# Patient Record
Sex: Female | Born: 1955
Health system: Southern US, Community
[De-identification: ages and names within clinical notes are randomized; demographics above are authoritative.]

## PROBLEM LIST (undated history)

## (undated) DIAGNOSIS — L409 Psoriasis, unspecified: Secondary | ICD-10-CM

## (undated) DIAGNOSIS — Z1501 Genetic susceptibility to malignant neoplasm of breast: Secondary | ICD-10-CM

## (undated) DIAGNOSIS — D219 Benign neoplasm of connective and other soft tissue, unspecified: Secondary | ICD-10-CM

## (undated) DIAGNOSIS — E7800 Pure hypercholesterolemia, unspecified: Secondary | ICD-10-CM

## (undated) DIAGNOSIS — J45909 Unspecified asthma, uncomplicated: Secondary | ICD-10-CM

## (undated) DIAGNOSIS — F419 Anxiety disorder, unspecified: Secondary | ICD-10-CM

## (undated) HISTORY — PX: DILATION AND CURETTAGE OF UTERUS: SHX78

## (undated) HISTORY — PX: BARTHOLIN GLAND CYST EXCISION: SHX565

## (undated) HISTORY — DX: Genetic susceptibility to malignant neoplasm of breast: Z15.01

## (undated) HISTORY — DX: Psoriasis, unspecified: L40.9

## (undated) HISTORY — DX: Anxiety disorder, unspecified: F41.9

## (undated) HISTORY — DX: Benign neoplasm of connective and other soft tissue, unspecified: D21.9

## (undated) HISTORY — DX: Unspecified asthma, uncomplicated: J45.909

## (undated) HISTORY — DX: Pure hypercholesterolemia, unspecified: E78.00

---

## 1986-09-11 HISTORY — PX: AUGMENTATION MAMMAPLASTY: SUR837

## 1998-08-20 ENCOUNTER — Other Ambulatory Visit: Admission: RE | Admit: 1998-08-20 | Discharge: 1998-08-20 | Payer: Self-pay | Admitting: Gynecology

## 1998-09-11 ENCOUNTER — Ambulatory Visit (HOSPITAL_COMMUNITY): Admission: RE | Admit: 1998-09-11 | Discharge: 1998-09-11 | Payer: Self-pay | Admitting: Family Medicine

## 1998-09-11 ENCOUNTER — Encounter: Payer: Self-pay | Admitting: Family Medicine

## 1999-07-23 ENCOUNTER — Ambulatory Visit (HOSPITAL_COMMUNITY): Admission: RE | Admit: 1999-07-23 | Discharge: 1999-07-23 | Payer: Self-pay | Admitting: Gynecology

## 1999-07-23 ENCOUNTER — Encounter (INDEPENDENT_AMBULATORY_CARE_PROVIDER_SITE_OTHER): Payer: Self-pay

## 1999-09-06 ENCOUNTER — Ambulatory Visit (HOSPITAL_COMMUNITY): Admission: RE | Admit: 1999-09-06 | Discharge: 1999-09-06 | Payer: Self-pay | Admitting: Family Medicine

## 1999-09-06 ENCOUNTER — Encounter: Payer: Self-pay | Admitting: Family Medicine

## 1999-12-23 ENCOUNTER — Encounter: Payer: Self-pay | Admitting: Family Medicine

## 1999-12-23 ENCOUNTER — Ambulatory Visit (HOSPITAL_COMMUNITY): Admission: RE | Admit: 1999-12-23 | Discharge: 1999-12-23 | Payer: Self-pay | Admitting: Family Medicine

## 2000-01-28 ENCOUNTER — Ambulatory Visit (HOSPITAL_COMMUNITY): Admission: RE | Admit: 2000-01-28 | Discharge: 2000-01-28 | Payer: Self-pay | Admitting: Family Medicine

## 2000-01-28 ENCOUNTER — Encounter: Payer: Self-pay | Admitting: Family Medicine

## 2000-06-05 ENCOUNTER — Encounter (INDEPENDENT_AMBULATORY_CARE_PROVIDER_SITE_OTHER): Payer: Self-pay

## 2000-06-05 ENCOUNTER — Ambulatory Visit (HOSPITAL_COMMUNITY): Admission: RE | Admit: 2000-06-05 | Discharge: 2000-06-05 | Payer: Self-pay | Admitting: Gynecology

## 2001-05-11 ENCOUNTER — Ambulatory Visit: Admission: RE | Admit: 2001-05-11 | Discharge: 2001-05-11 | Payer: Self-pay | Admitting: Gynecology

## 2001-05-25 ENCOUNTER — Ambulatory Visit (HOSPITAL_COMMUNITY): Admission: RE | Admit: 2001-05-25 | Discharge: 2001-05-25 | Payer: Self-pay | Admitting: Gynecology

## 2001-05-25 ENCOUNTER — Encounter (INDEPENDENT_AMBULATORY_CARE_PROVIDER_SITE_OTHER): Payer: Self-pay | Admitting: Specialist

## 2001-06-08 ENCOUNTER — Ambulatory Visit: Admission: RE | Admit: 2001-06-08 | Discharge: 2001-06-08 | Payer: Self-pay | Admitting: Gynecologic Oncology

## 2001-07-13 ENCOUNTER — Ambulatory Visit: Admission: RE | Admit: 2001-07-13 | Discharge: 2001-07-13 | Payer: Self-pay | Admitting: Gynecology

## 2001-08-06 ENCOUNTER — Other Ambulatory Visit: Admission: RE | Admit: 2001-08-06 | Discharge: 2001-08-06 | Payer: Self-pay | Admitting: Gynecology

## 2002-08-07 ENCOUNTER — Emergency Department (HOSPITAL_COMMUNITY): Admission: EM | Admit: 2002-08-07 | Discharge: 2002-08-07 | Payer: Self-pay

## 2002-09-05 ENCOUNTER — Other Ambulatory Visit: Admission: RE | Admit: 2002-09-05 | Discharge: 2002-09-05 | Payer: Self-pay | Admitting: Gynecology

## 2003-09-11 ENCOUNTER — Other Ambulatory Visit: Admission: RE | Admit: 2003-09-11 | Discharge: 2003-09-11 | Payer: Self-pay | Admitting: Gynecology

## 2003-12-15 ENCOUNTER — Ambulatory Visit (HOSPITAL_COMMUNITY): Admission: RE | Admit: 2003-12-15 | Discharge: 2003-12-15 | Payer: Self-pay | Admitting: Family Medicine

## 2004-09-12 ENCOUNTER — Other Ambulatory Visit: Admission: RE | Admit: 2004-09-12 | Discharge: 2004-09-12 | Payer: Self-pay | Admitting: Gynecology

## 2005-09-26 ENCOUNTER — Other Ambulatory Visit: Admission: RE | Admit: 2005-09-26 | Discharge: 2005-09-26 | Payer: Self-pay | Admitting: Gynecology

## 2006-10-15 ENCOUNTER — Other Ambulatory Visit: Admission: RE | Admit: 2006-10-15 | Discharge: 2006-10-15 | Payer: Self-pay | Admitting: Obstetrics and Gynecology

## 2008-01-17 ENCOUNTER — Encounter: Payer: Self-pay | Admitting: Obstetrics and Gynecology

## 2008-01-17 ENCOUNTER — Ambulatory Visit: Payer: Self-pay | Admitting: Obstetrics and Gynecology

## 2008-01-17 ENCOUNTER — Other Ambulatory Visit: Admission: RE | Admit: 2008-01-17 | Discharge: 2008-01-17 | Payer: Self-pay | Admitting: Obstetrics and Gynecology

## 2008-02-29 ENCOUNTER — Ambulatory Visit: Payer: Self-pay | Admitting: Obstetrics and Gynecology

## 2008-08-11 ENCOUNTER — Encounter: Admission: RE | Admit: 2008-08-11 | Discharge: 2008-08-11 | Payer: Self-pay | Admitting: Family Medicine

## 2009-01-22 ENCOUNTER — Ambulatory Visit: Payer: Self-pay | Admitting: Obstetrics and Gynecology

## 2009-01-22 ENCOUNTER — Other Ambulatory Visit: Admission: RE | Admit: 2009-01-22 | Discharge: 2009-01-22 | Payer: Self-pay | Admitting: Obstetrics and Gynecology

## 2010-02-10 HISTORY — PX: HYSTEROSCOPY: SHX211

## 2010-02-18 ENCOUNTER — Other Ambulatory Visit
Admission: RE | Admit: 2010-02-18 | Discharge: 2010-02-18 | Payer: Self-pay | Source: Home / Self Care | Admitting: Obstetrics and Gynecology

## 2010-02-18 ENCOUNTER — Ambulatory Visit
Admission: RE | Admit: 2010-02-18 | Discharge: 2010-02-18 | Payer: Self-pay | Source: Home / Self Care | Attending: Obstetrics and Gynecology | Admitting: Obstetrics and Gynecology

## 2010-03-05 ENCOUNTER — Ambulatory Visit
Admission: RE | Admit: 2010-03-05 | Discharge: 2010-03-05 | Payer: Self-pay | Source: Home / Self Care | Attending: Obstetrics and Gynecology | Admitting: Obstetrics and Gynecology

## 2010-03-07 ENCOUNTER — Ambulatory Visit
Admission: RE | Admit: 2010-03-07 | Discharge: 2010-03-07 | Payer: Self-pay | Source: Home / Self Care | Attending: Obstetrics and Gynecology | Admitting: Obstetrics and Gynecology

## 2010-03-25 ENCOUNTER — Other Ambulatory Visit (INDEPENDENT_AMBULATORY_CARE_PROVIDER_SITE_OTHER): Payer: BC Managed Care – PPO

## 2010-03-25 DIAGNOSIS — Z01818 Encounter for other preprocedural examination: Secondary | ICD-10-CM

## 2010-03-29 ENCOUNTER — Ambulatory Visit (HOSPITAL_BASED_OUTPATIENT_CLINIC_OR_DEPARTMENT_OTHER)
Admission: RE | Admit: 2010-03-29 | Discharge: 2010-03-29 | Disposition: A | Discharge status: Home / Self Care | Payer: BC Managed Care – PPO | Source: Ambulatory Visit | Attending: Obstetrics and Gynecology | Admitting: Obstetrics and Gynecology

## 2010-03-29 ENCOUNTER — Other Ambulatory Visit: Payer: Self-pay | Admitting: Obstetrics and Gynecology

## 2010-03-29 DIAGNOSIS — N840 Polyp of corpus uteri: Secondary | ICD-10-CM | POA: Insufficient documentation

## 2010-03-29 DIAGNOSIS — N950 Postmenopausal bleeding: Secondary | ICD-10-CM | POA: Insufficient documentation

## 2010-03-29 DIAGNOSIS — Z01810 Encounter for preprocedural cardiovascular examination: Secondary | ICD-10-CM | POA: Insufficient documentation

## 2010-03-29 DIAGNOSIS — Z01812 Encounter for preprocedural laboratory examination: Secondary | ICD-10-CM | POA: Insufficient documentation

## 2010-03-29 DIAGNOSIS — Z8041 Family history of malignant neoplasm of ovary: Secondary | ICD-10-CM | POA: Insufficient documentation

## 2010-04-02 NOTE — Op Note (Addendum)
Chelsey Shepherd, MEN                ACCOUNT NO.:  [id number]  MEDICAL RECORD NO.:  [id number]          PATIENT TYPE:  LOCATION:                                 FACILITY:  PHYSICIAN:  Daniel L. Eda Paschal, M.D.   DATE OF BIRTH:  DATE OF PROCEDURE:  03/29/2010 DATE OF DISCHARGE:                              OPERATIVE REPORT   PREOPERATIVE DIAGNOSIS:  Postmenopausal bleeding, endometrial polyp.  POSTOPERATIVE DIAGNOSIS:  Postmenopausal bleeding, endometrial polyp.  OPERATION:  Hysteroscopy with excision of endometrial polyp and endometrial sampling.  SURGEON:  Daniel L. Eda Paschal, M.D.  ANESTHESIA:  General.  INDICATIONS:  The patient is a 55 year old gravida 2, para 0, AB 2 who had presented to the office for her yearly exam.  During the process of the yearly exam, we had made a decision to do an ultrasound because of her family history of ovarian cancer.  At the time of her ultrasound, her endometrial cavity was slightly enlarged and there was a well- defined endometrial polyp of approximately 1 cm present.  In questioning the patient more carefully at that point, it appeared that she had postmenopausal bleeding which initially she had denied.  She said she had spotted off and on for 2 months.  We were going to initiate hormone replacement therapy, but this bleeding had occurred prior to that.  As a result of the postmenopausal bleeding and the well-defined polyp, she now enters the operating room for hysteroscopy, D and C and excision of endometrial polyp.  FINDINGS:  External is normal.  BUS is normal.  Vagina is normal. Cervix is clean, but stenotic.  Uterus is normal size and shape without descensus.  Adnexa failed to reveal masses.  At the time of hysteroscopy, there was a well-defined endometrial polyp on the anterior fundal wall near the top of the fundus, tubal ostia, rest of the fundus, lower uterine segment and endocervical canal were free of disease.  PROCEDURE  IN DETAIL:  After adequate general anesthesia, the patient was placed in the dorsal lithotomy position and prepped and draped in the usual sterile manner.  A single-tooth tenaculum was placed in the anterior lip of the cervix.  Initially it was a little bit difficult to dilate the cervix because of the stenosis and her nulliparous state. However, we were successful and she was dilated to a #31 Pratt dilator. A hysteroscopic resectoscope was then introduced.  It was attached to a camera for magnification.  It was attached to a 90 degrees wire loop with appropriate Bovie settings, 1.5% glycine was used to expand the intrauterine cavity.  When the cavity was expanded, the endometrial polyp was identified.  It was excised with the 90 degrees wire loop and the base was cauterized.  It was sent to Pathology for tissue diagnosis. Endometrial curettings were also obtained, although there was really scant tissue, as she had a typical postmenopausal atrophic endometrium. At the termination of procedure, she was re-hysteroscoped.  There was absolutely no bleeding.  Fluid deficit was less than 100 cc.  Blood loss was minimal, so the procedure was terminated.  Pictures were taken for documentation.  The  patient left the operating room in satisfactory condition.     Daniel L. Eda Paschal, M.D.     Tonette Bihari  D:  03/29/2010  T:  03/29/2010  Job:  [id number]  Electronically Signed by Edyth Gunnels M.D. on 04/02/2010 09:19:20 AM

## 2010-04-15 ENCOUNTER — Ambulatory Visit (INDEPENDENT_AMBULATORY_CARE_PROVIDER_SITE_OTHER): Payer: BC Managed Care – PPO | Admitting: Obstetrics and Gynecology

## 2010-04-15 DIAGNOSIS — Z9889 Other specified postprocedural states: Secondary | ICD-10-CM

## 2010-06-28 NOTE — Op Note (Signed)
[facility]  Patient:    Chelsey Shepherd, Chelsey Shepherd                      MRN: [id number] Proc. Date: 07/23/99 Adm. Date:  [id number] Disc. Date: [id number] Attending:  Douglass Rivers                           Operative Report  PREOPERATIVE DIAGNOSIS:       Missed abortion, 9+ weeks.  POSTOPERATIVE DIAGNOSIS:      1. Missed abortion, 9+ weeks.                               2. Possible cervical pregnancy.  PROCEDURE:                    ______ dilation and evacuation.  SURGEON:                      Douglass Rivers, M.D.  ANESTHESIA:                   MAC and paracervical block.  ESTIMATED BLOOD LOSS:         100-200 cc.  FINDINGS:                     Normal cavity contour, brisk cervical bleeding.  COMPLICATIONS:                Cervical bleeding.  PATHOLOLGY:                   Uterine contents.  INDICATIONS:                  The patient is a 55 year old G2, P0 at 9 weeks confirmed missed AB by ultrasound.  The pregnancy was seen in the lower portion of the uterus without fetal heart tones.  DESCRIPTION OF PROCEDURE:     The patient was taken to the operating room and IV sedation was induced.  The patient was placed in the dorsal lithotomy position, prepped and draped in the usual sterile fashion.  A bivalve speculum was placed in the vagina.  The cervix was visualized and a paracervical block was placed.  The bivalve was removed and ______ confirmed.  A sterile weighted speculum was placed in the vagina.  However, the sterile weighted speculum was not long enough to visualized the cervix.  The bivalve was then replaced.  The cervix was stabilized with a single tooth tenaculum and gently dilated up to a #27 [place].  The #9 suction curet was advanced though the cervix.  Upon advancing through the cervix, brisk bleeding through the curet was noted.  Suction was obtained.  The uterus was cleared of its contents. The suction curet was removed and brisk bleeding  was noted pumping through the cervix.  The suction curet was then advanced back through the cervix.  The uterus was massaged.  The suction curet was removed.  Gentle curettage was performed, during which the vaginal bleeding worsened.  The suction curet was then advanced back through the cervix.  The patients uterus was massaged and the patient received Methergen.  After massage, the suction curet was removed. The uterus was continued to be massaged.  After about 12 minutes, a second Methergen dose was given for reassurance.  The instruments were removed then from  the vagina.  The patient appeared to be stable, at which point I left the OR.  Shortly thereafter, I was called back to the OR, as the patient began bleeding briskly again.  The bivalve speculum was placed in the vagina and there was a moderate amount of blood pouring through the cervix.  The single tooth tenaculum was replaced.  The uterus was massaged while and adequate sterile weighted speculum was obtained.  The cervix was then deviated left and right, and stay sutures were placed at 3 and 9 oclock with 2-0 Vicryl.  The uterus continued to be massaged and another dose of Methergen was given. While it slowed down, she did continue to bleed from her cervix.  The cervix was then injected with dilute Pitressin solution in four quadrants directed toward the internal os.  Approximately 10 cc of a solution of 20 units in 100 was placed in the cervix.  Again, uterine massage was performed.  A #16, 30 cc Foley was then advanced through the cervix, distended to 20 cc and placed on gentle traction.  Inspection of the cervix confirmed hemostasis.  There was a mild amount of blood coming through the Foley catheter, which would have been uterine in source.  We continued to observe the patient in the OR for about another 15-20 minutes.  She was given another dose of IM Methergen.  As she appeared stable, she was transferred to the PACU for  continued observation there.  If warranted, we will admit her for observation.  The uterine contents were sent for pathology. DD:  07/23/99 TD:  07/25/99 Job: [id number]

## 2010-06-28 NOTE — Op Note (Signed)
[facility]  Patient:    Chelsey Shepherd, Chelsey Shepherd Visit Number: [id number] MRN: [id number]          Service Type: DSU Location: DAY Attending Physician:  Jeannette Corpus Dictated by:   Rande Brunt. Clarke-Pearson, M.D. Proc. Date: 05/25/01 Admit Date:  05/25/2001   CC:         Douglass Rivers, M.D.  Telford Nab, R.N.   Operative Report  PREOPERATIVE DIAGNOSIS:  Chronic Bartholins abscess.  POSTOPERATIVE DIAGNOSIS:  Perineal-anal fistula.  PROCEDURE:  Excision of fistulous tract with perineoplasty.  SURGEON:  Daniel L. Clarke-Pearson, M.D.  ASSISTANT:  Telford Nab, R.N.  ANESTHESIA:  General with LMA.  ESTIMATED BLOOD LOSS:  30 cc.  SURGICAL FINDINGS:  The patient had a chronic draining sinus on the left side of the perineum.  When probing this with a wire probe at the time of surgery, it was noted that the sinus tract actually exited through the anus just above the anal sphincter.  DESCRIPTION OF PROCEDURE:  The patient was brought to the operating room and after satisfactory attainment of general anesthesia was placed in the lithotomy position in candy-cane stirrups.  The perineum and vagina were prepped with Betadine.  The patient was draped, and the bladder was drained with a straight catheter.  The sinus was probed with the wire probe, and it was noted that the probe exited a sinus tract exiting the anus.  With the probe in place, skin incision was then made overlying the probe, thus opening up the sinus tract.  In order to to do this, the anal sphincter was transected.  Using sharp dissection with Metzenbaum scissors and hemostasis being achieved with cautery, the sinus tract as well as the scar tissue in the perineum was excised completely back to healthy fat and normal rectal mucosa. Hemostasis was achieved with cautery and figure-of-eight 3-0 Vicryl sutures. The distal rectal mucosa was reapproximated with interrupted 3-0  Vicryl suture.  The anal sphincter was reapproximated with interrupted 2-0 Vicryl sutures in a circumferential fashion.  The deep perineal body was then reapproximated with interrupted sutures of 2-0 Vicryl.  The perineal/bulbar skin was then closed with a running subcuticular suture of 3-0 Vicryl.  The wound was injected with 0.25% Marcaine.  An ice pack was applied to the vulva and anus, and the patient was awakened from anesthesia and taken to the recovery room in satisfactory condition.  Sponge, needle, and instrument counts correct x 2. Dictated by:   Rande Brunt. Clarke-Pearson, M.D. Attending Physician:  Jeannette Corpus DD:  05/25/01 TD:  05/25/01 Job: [id number]

## 2010-06-28 NOTE — Consult Note (Signed)
[facility]  Patient:    Chelsey Shepherd, Chelsey Shepherd Visit Number: [id number] MRN: [id number]          Service Type: GON Location: GYN Attending Physician:  Jeannette Corpus Dictated by:   Rande Brunt. Clarke-Pearson, M.D. Proc. Date: 07/13/01 Admit Date:  07/13/2001 Discharge Date: 07/13/2001   CC:         Douglass Rivers, M.D.  Telford Nab, R.N.   Consultation Report  A 55 year old white female returns for postoperative follow-up having had a fistula in ano repaired on May 25, 2001.  She has had an uncomplicated postoperative course.  She reports today she is having normal bowel movements once a day.  She has no vaginal or perineal drainage, although she does occasionally have a slight amount of rectal bleeding.  Her stools are formed and sometimes constipated.  She did have hemorrhoids immediately following surgery which resolved spontaneously.  Currently, the patient is without pain and denies any pressure, swelling, or drainage.  PHYSICAL EXAMINATION  VITAL SIGNS:  Weight 125 pounds, blood pressure 100/60.  ABDOMEN:  Soft, nontender.  No mass, organomegaly, ascites, or hernias noted.  PELVIC:  EGBUS shows that the incision to the left of the midline and the perineum is well healed.  Palpation of this area reveals no nodularity, masses, fluctuance, or pain.  Inspection of the vagina reveals no abnormalities.  RECTAL:  Performed.  Again, there are small hemorrhoids, but no other abnormalities noted.  There is no blood on my rectal finger.  Palpation of the rectum reveals no evidence of communication to the vagina or perineum.  IMPRESSION:  Fistula in ano of questionable etiology.  Patient is given the okay to return to full levels of activity including resuming sexual intercourse.  Prior to the conclusion of our discussion the patient raised a question regarding family history of ovarian cancer.  She reports that her paternal grandmother  and paternal great grandmother both had ovarian cancer.  Given this history, I would suggest that she seek genetic counseling.  It would be reasonable in my mind to screen her with annual ultrasounds of the ovaries.  I do not believe that CA-125 would be worthwhile given the high false negative and false positive rates.  She will return to the care of Dr. Douglass Rivers. Dictated by:   Rande Brunt. Clarke-Pearson, M.D. Attending Physician:  Jeannette Corpus DD:  07/13/01 TD:  07/15/01 Job: [id number]

## 2010-06-28 NOTE — Consult Note (Signed)
[facility]  Patient:    Chelsey Shepherd, Chelsey Shepherd Visit Number: [id number] MRN: [id number]          Service Type: GON Location: GYN Attending Physician:  Sabino Donovan Dictated by:   Rande Brunt. Clarke-Pearson, M.D. Proc. Date: 06/15/01 Admit Date:  06/08/2001 Discharge Date: 06/08/2001   CC:         Douglass Rivers, M.D.  Telford Nab, R.N.   Consultation Report  A 55 year old patient returns for continued evaluation and postoperative recovery following repair of a fistula in ______.  Since her last visit she has gone back to work nearly full-time.  She reports she still has several bowel movements a day and a minimal amount of drainage from the anus.  She has minimal pain.  She is using sitz baths.  She denies any fever or chills or any loss of stool or flatus.  PHYSICAL EXAMINATION  VITAL SIGNS:  Weight 120 pounds, blood pressure 178/70.  PELVIC:  EGBUS is essentially normal.  The perineal incision is completely healed.  She does have some relatively prominent hemorrhoids at approximately 12 oclock.  Palpation of the vagina reveals no masses or significant tenderness.  Rectovaginal examination confirms.  There is no drainage of her sinus tracts or abscesses noted.  IMPRESSION:  Good postoperative recovery.  Patient is encouraged to use a donut when she is sitting at work to take pressure off her perineum and rectum.  She will continue to use sitz baths.  We will plan on seeing her back again in three to four weeks for follow-up. Dictated by:   Rande Brunt. Clarke-Pearson, M.D. Attending Physician:  Ronita Hipps T DD:  06/15/01 TD:  06/17/01 Job: [id number]

## 2010-06-28 NOTE — Consult Note (Signed)
[facility]  Patient:    Chelsey Shepherd, TIPPING Visit Number: [id number] MRN: [id number]          Service Type: Attending:  Rande Brunt. Clarke-Pearson, M.D. Dictated by:   Rande Brunt. Clarke-Pearson, M.D. Proc. Date: 05/11/01   CC:         Douglass Rivers, M.D.  Telford Nab, R.N.   Consultation Report  A 55 year old white married female referred by Dr. Douglass Rivers for management of a chronic Bartholins gland abscess.  The patient dates the initial onset of this abscess to approximately three years ago.  At that time she underwent incision and drainage.  However, this has recurred and subsequently was excised on June 05, 2000 showing benign inflamed squamous mucosa and submucosa consistent with an abscess.  No Bartholins gland was identified.  Subsequently, the patient has had continuing difficulties with pain, drainage, and bleeding from this area as well as a lump there.  She is having difficulty with sexual intercourse.  PAST MEDICAL HISTORY:  None.  PAST SURGICAL HISTORY:  D&C for incomplete abortion.  ALLERGIES:  None.  CURRENT MEDICATIONS:  None.  FAMILY HISTORY:  Negative for gynecologic, breast, or colon cancers.  REVIEW OF SYSTEMS:  Negative except as noted above.  SOCIAL HISTORY:  The patient is married.  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  PHYSICAL EXAMINATION  VITAL SIGNS:  Weight 123 pounds, height 5 feet 5 inches, blood pressure 120/80, pulse 68, respiratory rate 16.  GENERAL:  The patient is a healthy, slender white female in no acute distress.  HEENT:  Negative.  NECK:  Supple without thyromegaly.  LYMPH:  There is no supraclavicular or inguinal adenopathy.  ABDOMEN:  Soft, nontender.  No mass, organomegaly, ascites, or hernias are noted.  PELVIC:  EGBUS normal except for a nodule with draining sinus tract in the left posterior vulva consistent with a chronic Bartholins gland abscess  and associated fibrosis.  In aggregate this measures less than 1 cm in diameter. Palpation of the vagina reveals no extension.  The vagina is otherwise normal. Cervix is normal.  Uterus is anterior and normal shape, size, and consistency. There are no adnexal masses noted.  Rectovaginal examination confirms.  RECTAL:  Reveals no involvement, although this is sitting close to the rectal sphincter.  IMPRESSION:  Chronic Bartholins gland abscess now for about three years.  The patient strongly desires coming to resolution of this problem and I would advise wide local excision of this area with primary closure.  We will schedule this surgery to be done as an outpatient as soon as it is convenient. The risks of surgery including bleeding, infection, and wound breakdown have been discussed.  Patient accepts these risks and wishes to proceed. Dictated by:   Rande Brunt. Clarke-Pearson, M.D. Attending:  Rande Brunt. Clarke-Pearson, M.D. DD:  05/11/01 TD:  05/12/01 Job: [id number]

## 2010-06-28 NOTE — Consult Note (Signed)
[facility]  Patient:    CHASTIDY, RANKER Visit Number: [id number] MRN: [id number]          Service Type: GON Location: GYN Attending Physician:  Sabino Donovan Dictated by:   Jackquline Denmark. Kyla Balzarine, M.D. Admit Date:  06/08/2001   CC:         Telford Nab, R.N.  Douglass Rivers, M.D.   Consultation Report  HISTORY:  The patient is a drop in for evaluation of the perineum following resection of a perianal fistula tract, repaired with perineoplasty on May 25, 2001.  The patient has been concerned about scanty, malodorous discharge. The patient has had bowel function.  She does describe the sensation of vaginal flatus.  She has no problems with stool continence or drainage of stool or pus from the vagina.  PHYSICAL EXAMINATION:  VITAL SIGNS:  Stable and afebrile.  PELVIC:  Inspection of the perineum reveals an intact suture line with minimal edema/induration of the perineal body.  There is no sign of purulence.  There is no evidence of fistula on visual inspection just inside the introitus.  ASSESSMENT: 1. Fistula in ano postrepair. 2. Postoperative induration as expected.  PLAN:  The patient is reassured and may return to work.  We discussed the use of stool softeners and bulking agents.  She does note some pruritus, and can use 1% hydrocortisone ointment.  We discussed the use of Sitz baths, rinse/blot/blow dry regimen for vulvar hygiene.  She is scheduled to return to see Dr. Stanford Breed in one month. Dictated by:   Jackquline Denmark. Kyla Balzarine, M.D. Attending Physician:  Ronita Hipps T DD:  06/08/01 TD:  06/08/01 Job: [id number]

## 2010-06-28 NOTE — Op Note (Signed)
[facility]  Patient:    Chelsey Shepherd, Chelsey Shepherd                         MRN: [id number] Proc. Date: 06/05/00 Attending:  Douglass Rivers, M.D.                           Operative Report  PREOPERATIVE DIAGNOSES:       1. Left Bartholins fistula                               2. Dyspareunia.  POSTOPERATIVE DIAGNOSES:      1. Left Bartholins fistula                               2. Dyspareunia.  PROCEDURE:                    Excision of left Bartholins gland.  SURGEON:                      Douglass Rivers, M.D.  ANESTHESIA:                   General.  ESTIMATED BLOOD LOSS:         Minimal.  INDICATIONS:                  The patient is a 55 year old with a previous history of a Bartholins cyst, which underwent an I&D with packing, since which time she has had a draining fistula.  The patient reports increase in fistula drainage with mild blood tinging and dyspareunia, and presents for definitive surgery.  FINDINGS:                     There were hypertrophic changes to the fistula noted externally on the left labia majora.  There were indurated changes to the Bartholins gland itself.  COMPLICATIONS:                None.  PATHOLOGY:                    Bartholins gland.  DESCRIPTION OF PROCEDURE:     The patient was taken to the operating room, placed in the dorsal lithotomy position, prepped and draped in the usual sterile fashion after general anesthesia was induced.  The hypertrophic area of fistula was noted.  The fistula was probed and the orientation of the gland was verified.  The gland itself was not enlarged, but did feel slightly indurated.  The labia was stabilized with with a Kelly clamp.  The fistula was grasped and an elliptical incision was made around the fistula.  Careful attention to any underlying bleeding was treated with electrocautery.  The orientation of the gland again was redirected.  The area of concern and induration was stabilized  with an Allis clamp and was sharply dissected off. There was a small area of bleeding on the inferior aspect of the excision that was treated with a figure-of-eight, which also closed the dead space.  The skin was then closed with interrupted 2-0 Monocryl, which was used throughout. Examination of the area confirmed excision of the gland in its entirety.  Of note, we attempted to confirm this by placing a lacrimal duct dilator through the  anatomic Bartholins opening in the vagina.  However, because of scar tissue, we were unable to adequately ascertain its location.  The area was injected with 0.25% Marcaine solution, 6 cc of which was placed.  She tolerated the procedure well.  Sponge, lap, and needle counts were correct x 2.  She was extubated in the OR and transferred to the PACU in stable condition.  She was instructed to avoid anti-inflammatories.  Sitz baths to begin tomorrow.  She was given a prescription for Tylox, 1-2 q.4h. p.r.n. pain.  She will follow up in the office two weeks postoperatively. DD:  06/05/00 TD:  06/05/00 Job: [id number]

## 2011-02-22 ENCOUNTER — Other Ambulatory Visit: Payer: Self-pay | Admitting: Obstetrics and Gynecology

## 2011-03-05 ENCOUNTER — Other Ambulatory Visit: Payer: Self-pay | Admitting: Obstetrics and Gynecology

## 2011-03-07 DIAGNOSIS — E7800 Pure hypercholesterolemia, unspecified: Secondary | ICD-10-CM | POA: Insufficient documentation

## 2011-03-10 ENCOUNTER — Encounter: Payer: Self-pay | Admitting: Obstetrics and Gynecology

## 2011-03-12 ENCOUNTER — Other Ambulatory Visit: Payer: Self-pay | Admitting: Obstetrics and Gynecology

## 2011-03-31 ENCOUNTER — Other Ambulatory Visit: Payer: Self-pay | Admitting: Obstetrics and Gynecology

## 2011-03-31 ENCOUNTER — Other Ambulatory Visit (HOSPITAL_COMMUNITY)
Admission: RE | Admit: 2011-03-31 | Discharge: 2011-03-31 | Disposition: A | Discharge status: Home / Self Care | Payer: BC Managed Care – PPO | Source: Ambulatory Visit | Attending: Obstetrics and Gynecology | Admitting: Obstetrics and Gynecology

## 2011-03-31 ENCOUNTER — Ambulatory Visit (INDEPENDENT_AMBULATORY_CARE_PROVIDER_SITE_OTHER): Payer: BC Managed Care – PPO

## 2011-03-31 ENCOUNTER — Encounter: Payer: Self-pay | Admitting: Obstetrics and Gynecology

## 2011-03-31 ENCOUNTER — Other Ambulatory Visit: Payer: BC Managed Care – PPO

## 2011-03-31 ENCOUNTER — Ambulatory Visit (INDEPENDENT_AMBULATORY_CARE_PROVIDER_SITE_OTHER): Payer: BC Managed Care – PPO | Admitting: Obstetrics and Gynecology

## 2011-03-31 VITALS — BP 114/70 | Ht 64.0 in | Wt 136.0 lb

## 2011-03-31 DIAGNOSIS — F419 Anxiety disorder, unspecified: Secondary | ICD-10-CM | POA: Insufficient documentation

## 2011-03-31 DIAGNOSIS — D252 Subserosal leiomyoma of uterus: Secondary | ICD-10-CM

## 2011-03-31 DIAGNOSIS — N8000 Endometriosis of the uterus, unspecified: Secondary | ICD-10-CM

## 2011-03-31 DIAGNOSIS — Z8041 Family history of malignant neoplasm of ovary: Secondary | ICD-10-CM

## 2011-03-31 DIAGNOSIS — D259 Leiomyoma of uterus, unspecified: Secondary | ICD-10-CM

## 2011-03-31 DIAGNOSIS — L409 Psoriasis, unspecified: Secondary | ICD-10-CM | POA: Insufficient documentation

## 2011-03-31 DIAGNOSIS — D219 Benign neoplasm of connective and other soft tissue, unspecified: Secondary | ICD-10-CM | POA: Insufficient documentation

## 2011-03-31 DIAGNOSIS — Z01419 Encounter for gynecological examination (general) (routine) without abnormal findings: Secondary | ICD-10-CM

## 2011-03-31 DIAGNOSIS — N83339 Acquired atrophy of ovary and fallopian tube, unspecified side: Secondary | ICD-10-CM

## 2011-03-31 DIAGNOSIS — D251 Intramural leiomyoma of uterus: Secondary | ICD-10-CM

## 2011-03-31 LAB — URINALYSIS W MICROSCOPIC + REFLEX CULTURE
Glucose, UA: NEGATIVE mg/dL
Ketones, ur: NEGATIVE mg/dL
Leukocytes, UA: NEGATIVE
Protein, ur: NEGATIVE mg/dL
WBC, UA: NONE SEEN WBC/hpf (ref <3)

## 2011-03-31 MED ORDER — PROGESTERONE MICRONIZED 200 MG PO CAPS: ORAL_CAPSULE | ORAL | Status: DC

## 2011-03-31 MED ORDER — ESTRADIOL 0.05 MG/24HR TD PTWK: 1.0000 | MEDICATED_PATCH | TRANSDERMAL | Status: DC

## 2011-03-31 NOTE — Progress Notes (Signed)
Patient came to see me today for her annual GYN exam. We did an ultrasound due to her fibroid, adenomyosis, and family history of paternal grandmother, paternal great-grandmother, and paternal great aunt with ovarian cancer. She is doing very well on her HRT with relief of all her symptoms. She is not having hot flashes, lack of focus, sleep disturbance, or vaginal dryness which she was having previously. She is having no withdrawal bleeding. She is a normal mammogram. She a normal bone density last year. She does her lab her PCP. She is having no other GYN symptoms. She tried to get off her Effexor since she is happy on her HRT. She is found that she gets dizzy and lightheaded and cannot tolerate been off of it.  Physical examination: Kennon Portela present. HEENT within normal limits. Neck: Thyroid not large. No masses. Supraclavicular nodes: not enlarged. Breasts: Examined in both sitting midline position. No skin changes and no masses. Abdomen: Soft no guarding rebound or masses or hernia. Pelvic: External: Within normal limits. BUS: Within normal limits. Vaginal:within normal limits. Good estrogen effect. No evidence of cystocele rectocele or enterocele. Cervix: clean. Uterus: Normal size and shape. Adnexa: No masses. Rectovaginal exam: Confirmatory and negative. Extremities: Within normal limits.  Pelvic ultrasound: The patient has an anteverted uterus with a stable fibroid. Her endometrial echo is 2.9 mm. She does have cortical cystic areas in her myometrium. Her ovaries are normal. Her cul-de-sac is free of fluid.  Assessment: #1. Menopausal symptoms #2. Inability get off her Effexor.  Plan: Continue her HRT as above. Yearly prescriptions written. Effexor 37.5 mg tablets 1 daily x2 weeks then one every other day x2 weeks then one every third day x2 weeks and stop. Discussed Prozac withdrawal.

## 2012-05-21 ENCOUNTER — Other Ambulatory Visit: Payer: Self-pay | Admitting: Family Medicine

## 2012-05-21 DIAGNOSIS — R1011 Right upper quadrant pain: Secondary | ICD-10-CM

## 2012-05-24 ENCOUNTER — Encounter: Payer: Self-pay | Admitting: Gynecology

## 2012-05-24 ENCOUNTER — Ambulatory Visit (INDEPENDENT_AMBULATORY_CARE_PROVIDER_SITE_OTHER): Payer: BC Managed Care – PPO | Admitting: Gynecology

## 2012-05-24 ENCOUNTER — Ambulatory Visit
Admission: RE | Admit: 2012-05-24 | Discharge: 2012-05-24 | Disposition: A | Discharge status: Home / Self Care | Payer: BC Managed Care – PPO | Source: Ambulatory Visit | Attending: Family Medicine | Admitting: Family Medicine

## 2012-05-24 VITALS — BP 120/70 | Ht 64.0 in | Wt 135.0 lb

## 2012-05-24 DIAGNOSIS — N951 Menopausal and female climacteric states: Secondary | ICD-10-CM

## 2012-05-24 DIAGNOSIS — R1011 Right upper quadrant pain: Secondary | ICD-10-CM

## 2012-05-24 DIAGNOSIS — D259 Leiomyoma of uterus, unspecified: Secondary | ICD-10-CM

## 2012-05-24 DIAGNOSIS — Z01419 Encounter for gynecological examination (general) (routine) without abnormal findings: Secondary | ICD-10-CM

## 2012-05-24 DIAGNOSIS — Z13220 Encounter for screening for lipoid disorders: Secondary | ICD-10-CM

## 2012-05-24 LAB — LIPID PANEL
Cholesterol: 239 mg/dL — ABNORMAL HIGH (ref 0–200)
HDL: 82 mg/dL (ref >39)
Total CHOL/HDL Ratio: 2.9 Ratio

## 2012-05-24 MED ORDER — ESTRADIOL 0.5 MG PO TABS: 0.5000 mg | ORAL_TABLET | Freq: Every day | ORAL | Status: DC

## 2012-05-24 MED ORDER — PROGESTERONE MICRONIZED 100 MG PO CAPS: 100.0000 mg | ORAL_CAPSULE | Freq: Every day | ORAL | Status: DC

## 2012-05-24 NOTE — Patient Instructions (Signed)
Follow up for ultrasound as scheduled. Start hormone replacement as you choose.

## 2012-05-24 NOTE — Progress Notes (Signed)
Chelsey Shepherd 1955/10/25 [id number]        57 y.o.  G2P0020 for annual exam.  Former patient of Dr. Eda Paschal. Several issues noted below.  Past medical history,surgical history, medications, allergies, family history and social history were all reviewed and documented in the EPIC chart. ROS:  Was performed and pertinent positives and negatives are included in the history.  Exam: Kim assistant Filed Vitals:   05/24/12 1100  BP: 120/70  Height: 5\' 4"  (1.626 m)  Weight: 135 lb (61.236 kg)   General appearance  Normal Skin grossly normal Head/Neck normal with no cervical or supraclavicular adenopathy thyroid normal Lungs  clear Cardiac RR, without RMG Abdominal  soft, nontender, without masses, organomegaly or hernia Breasts  examined lying and sitting without masses, retractions, discharge or axillary adenopathy.  Bilateral implants noted Pelvic  Ext/BUS/vagina  normal   Cervix  normal   Uterus  anteverted, normal size, shape and contour, midline and mobile nontender   Adnexa  Without masses or tenderness    Anus and perineum  normal   Rectovaginal  normal sphincter tone without palpated masses or tenderness.    Assessment/Plan:  57 y.o. G6P0020 female for annual exam.   1. Menopausal symptoms.  History of HRT in the past. Stopped this this past year for fear of breast cancer. She is having vaginal dryness, sleep disturbance and hot flashes.  Also had been on Effexor but stopped this also.  Family history as noted below. Reviewed WHI study with increased risk of stroke heart attack DVT and breast cancer. ACOG and NAMS statements for lowest dose for shortest period of time reviewed. Transdermal benefits first-pass effect issues discussed. After lengthy discussion she thinks she wants to restart HRT. She clearly understands the issues and risks to include breast cancer/ovarian cancer.  She does not want patches as she try these before and had difficulty staying on. Estradiol 0.5 mg and  Prometrium 100 mg daily #30 each with 11 refills.   2. Family history ovarian cancer/breast cancer.  Has history of ovarian cancer in her father's side in 2 paternal aunts and paternal grandmother. Apparently one aunt was genetically tested and negative. Does have breast cancer in 2 maternal aunts on her mother's side and colon cancer in her mother's side. The possibility of genetic linkage reviewed. I reviewed and offered options. Apparently she is getting yearly ultrasounds by Dr. Eda Paschal. I discussed the short falls of this screening with missed cancers as well as false positives. The same is true with CA 125. Alternatives to include genetic counseling at the cancer Center, BRCA testing and the patient were continuing with screening discussed. Also the option of prophylactic BSO's discussed. The patient must continue with ultrasounds. She'll think about the other options and follow up if she wants to pursue.  I scheduled an ultrasound now. She also has a history of leiomyoma and it will allow [place] to look at this from a stability standpoint. 3. Mammography 04/2012. Continue with annual mammography. SBE monthly reviewed. 4. DEXA 02/2010 normal. Plan repeat at five-year interval. Increase calcium vitamin D reviewed. 5. Pap smear 03/2011. No Pap smear done today. No history of abnormal Pap smears previously. Discussed current screening guidelines we'll plan 3 year repeat interval. 6. Colonoscopy 5 years ago. Plan repeat at their recommended interval. 7. Health maintenance. Patient requests lipid profile. History of elevated lipids in the past. I reviewed with her if it is elevated she will need to see her primary physician for treatment and she  acknowledges this. Lipid profile ordered with urinalysis. No other blood work as this is done through her primary physician's office.  Follow up for ultrasound otherwise 1 year.    Dara Lords MD, 12:05 PM 05/24/2012

## 2012-05-25 LAB — URINALYSIS W MICROSCOPIC + REFLEX CULTURE
Bilirubin Urine: NEGATIVE
Casts: NONE SEEN
Glucose, UA: NEGATIVE mg/dL
Protein, ur: NEGATIVE mg/dL
Squamous Epithelial / LPF: NONE SEEN

## 2012-05-26 ENCOUNTER — Encounter: Payer: Self-pay | Admitting: Obstetrics and Gynecology

## 2012-05-31 ENCOUNTER — Other Ambulatory Visit (HOSPITAL_COMMUNITY): Payer: Self-pay | Admitting: Family Medicine

## 2012-05-31 DIAGNOSIS — R109 Unspecified abdominal pain: Secondary | ICD-10-CM

## 2012-06-07 ENCOUNTER — Encounter (HOSPITAL_COMMUNITY)
Admission: RE | Admit: 2012-06-07 | Discharge: 2012-06-07 | Disposition: A | Discharge status: Home / Self Care | Payer: BC Managed Care – PPO | Source: Ambulatory Visit | Attending: Family Medicine | Admitting: Family Medicine

## 2012-06-07 DIAGNOSIS — R109 Unspecified abdominal pain: Secondary | ICD-10-CM | POA: Insufficient documentation

## 2012-06-07 MED ORDER — TECHNETIUM TC 99M MEBROFENIN IV KIT
5.0000 | PACK | Freq: Once | INTRAVENOUS | Status: AC | PRN
  Administered 2012-06-07: 5 via INTRAVENOUS

## 2012-06-07 MED ORDER — SINCALIDE 5 MCG IJ SOLR: 0.0200 ug/kg | Freq: Once | INTRAMUSCULAR | Status: AC

## 2012-06-07 MED ORDER — SINCALIDE 5 MCG IJ SOLR
INTRAMUSCULAR | Status: AC
  Administered 2012-06-07: 5 ug via INTRAVENOUS
  Filled 2012-06-07: qty 5

## 2012-06-08 ENCOUNTER — Other Ambulatory Visit (HOSPITAL_COMMUNITY): Payer: BC Managed Care – PPO

## 2012-06-14 ENCOUNTER — Ambulatory Visit: Payer: BC Managed Care – PPO | Admitting: Gynecology

## 2012-06-14 ENCOUNTER — Other Ambulatory Visit: Payer: BC Managed Care – PPO

## 2012-06-16 ENCOUNTER — Encounter: Payer: Self-pay | Admitting: Gynecology

## 2012-06-16 ENCOUNTER — Ambulatory Visit (INDEPENDENT_AMBULATORY_CARE_PROVIDER_SITE_OTHER): Payer: BC Managed Care – PPO | Admitting: Gynecology

## 2012-06-16 ENCOUNTER — Ambulatory Visit (INDEPENDENT_AMBULATORY_CARE_PROVIDER_SITE_OTHER): Payer: BC Managed Care – PPO

## 2012-06-16 DIAGNOSIS — N83339 Acquired atrophy of ovary and fallopian tube, unspecified side: Secondary | ICD-10-CM

## 2012-06-16 DIAGNOSIS — D252 Subserosal leiomyoma of uterus: Secondary | ICD-10-CM

## 2012-06-16 DIAGNOSIS — D259 Leiomyoma of uterus, unspecified: Secondary | ICD-10-CM

## 2012-06-16 DIAGNOSIS — Z8041 Family history of malignant neoplasm of ovary: Secondary | ICD-10-CM

## 2012-06-16 DIAGNOSIS — D251 Intramural leiomyoma of uterus: Secondary | ICD-10-CM

## 2012-06-16 DIAGNOSIS — N8300 Follicular cyst of ovary, unspecified side: Secondary | ICD-10-CM

## 2012-06-16 NOTE — Patient Instructions (Signed)
Followup in one year for annual exam, sooner if any issues 

## 2012-06-16 NOTE — Progress Notes (Signed)
Patient presents in followup for ultrasound. History of leiomyoma and family history of breast, colon and ovarian cancer. Had been doing annual ultrasounds per Dr. Eda Paschal. We discussed options for genetic counseling and screening and she has declined this.  Ultrasound shows uterus overall normal in size with small myoma. Endometrial echo 1.8 mm. Right and left ovaries are visualized and normal. Cul-de-sac negative.  Assessment and plan: Small myoma stable. Otherwise normal ultrasound. Again rediscussed genetic counseling and screening and she declined. Patient restarted her HRT and is doing better and plans to continue. She'll followup in one year, sooner as needed.

## 2013-05-27 ENCOUNTER — Encounter: Payer: Self-pay | Admitting: Gynecology

## 2013-06-06 ENCOUNTER — Other Ambulatory Visit: Payer: Self-pay | Admitting: Gynecology

## 2013-06-14 ENCOUNTER — Ambulatory Visit (INDEPENDENT_AMBULATORY_CARE_PROVIDER_SITE_OTHER): Payer: BC Managed Care – PPO | Admitting: Gynecology

## 2013-06-14 ENCOUNTER — Encounter: Payer: Self-pay | Admitting: Gynecology

## 2013-06-14 VITALS — BP 112/66 | Ht 64.0 in | Wt 120.0 lb

## 2013-06-14 DIAGNOSIS — Z01419 Encounter for gynecological examination (general) (routine) without abnormal findings: Secondary | ICD-10-CM

## 2013-06-14 DIAGNOSIS — Z79890 Hormone replacement therapy (postmenopausal): Secondary | ICD-10-CM

## 2013-06-14 DIAGNOSIS — D251 Intramural leiomyoma of uterus: Secondary | ICD-10-CM

## 2013-06-14 MED ORDER — PROGESTERONE MICRONIZED 100 MG PO CAPS
ORAL_CAPSULE | ORAL | Status: DC
Start: 2013-06-14 — End: 2014-06-20

## 2013-06-14 MED ORDER — ESTRADIOL 0.5 MG PO TABS: ORAL_TABLET | ORAL | Status: DC

## 2013-06-14 NOTE — Progress Notes (Signed)
Chelsey Shepherd October 08, 1955 [id number]        58 y.o.  G2P0020 for annual exam.  Several issues noted below.  Past medical history,surgical history, problem list, medications, allergies, family history and social history were all reviewed and documented as reviewed in the EPIC chart.  ROS:  12 system ROS performed with pertinent positives and negatives included in the history, assessment and plan.  Included Systems: General, HEENT, Neck, Cardiovascular, Pulmonary, Gastrointestinal, Genitourinary, Musculoskeletal, Dermatologic, Endocrine, Hematological, Neurologic, Psychiatric Additional significant findings :  None   Exam: Kim assistant Filed Vitals:   06/14/13 1600  BP: 112/66  Height: 5' 4"  (1.626 m)  Weight: 120 lb (54.432 kg)   General appearance:  Normal affect, orientation and appearance. Skin: Grossly normal HEENT: Without gross lesions.  No cervical or supraclavicular adenopathy. Thyroid normal.  Lungs:  Clear without wheezing, rales or rhonchi Cardiac: RR, without RMG Abdominal:  Soft, nontender, without masses, guarding, rebound, organomegaly or hernia Breasts:  Examined lying and sitting without masses, retractions, discharge or axillary adenopathy. Bilateral implants noted Pelvic:  Ext/BUS/vagina normal with mild atrophic changes  Cervix  normal with mild atrophic changes  Uterus anteverted, normal size, shape and contour, midline and mobile nontender   Adnexa  Without masses or tenderness    Anus and perineum  Normal   Rectovaginal  Normal sphincter tone without palpated masses or tenderness.    Assessment/Plan:  58 y.o. G60P0020 female for annual exam.   1. Postmenopausal/HRT. Patient initiated estradiol 0.5 mg and Prometrium 100 mg nightly last year. She's had a good response as far as dyspareunia/vaginal dryness and her hot flushes.  I again reviewed the whole issue of HRT with her to include the WHI study with increased risk of stroke, heart attack, DVT and breast  cancer. The ACOG and NAMS statements for lowest dose for the shortest period of time reviewed. Transdermal versus oral first-pass effect benefit discussed. The issue as to when to stop reviewed. At this point as the patient has had a great response understands accepts risks and wants to continue I refilled her x1 year. Patient has had no vaginal bleeding. Patient knows to report any vaginal bleeding. 2. Strong family history of ovarian cancer on her father's side and breast cancer on her mother side as well as colon cancer on her mother side as noted in her family medical history in Epic as well as my 05/24/2012 note. Dr. Cherylann Banas had been doing annual ultrasounds for pelvic surveillance. She had discussed as I did in the past issues of genetic screening with BRCA testing. Apparently one of her ovarian cancer relatives were tested and negative. The pitfalls of ultrasound/CA 125 screening reviewed to include false positive in some false negatives. The issues of prophylactic preventative surgery such as mastectomies and salpingo-oophorectomies have been discussed. I strongly recommended the patient consider at least discussing her history with a Dietitian and consider genetic testing. The patient agrees to do so and we will help her arrange this through [facility] [employer]. Even if she does not pursue genetic testing a higher risk assessment may warrant further screening such as MRI. She does want the ultrasound and we'll go ahead and this scheduled this again the patient recognizes this is not perfect and there are false positive and negatives. She does not want to see 125. 3. Mammography 05/2013. Continued annual mammography. SBE monthly reviewed. 4. Pap smear 2013. No Pap smear done today. No history of significant abnormal Pap smears. We'll plan repeat  Pap smear next year at a 3 year interval. 5. Leiomyoma. Patient has a history of small leiomyoma on ultrasound. Exam is overall normal. We'll  recheck with the ultrasound per #2. 6. Colonoscopy 2014 normal. Discussed probable five-year interval repeat given her strong family history. Will default to the gastroenterologist's recommendation. 7. Health maintenance. No routine blood work done and she reports is all done through her primary physician's office.   Note: This document was prepared with digital dictation and possible smart phrase technology. Any transcriptional errors that result from this process are unintentional.   [name] MD, 4:59 PM 06/14/2013

## 2013-06-14 NOTE — Patient Instructions (Signed)
Office will call you to help arrange genetic counseling appointment. Continue on hormone replacement as we discussed. Report any vaginal bleeding. Followup in one year for annual exam.

## 2013-06-15 ENCOUNTER — Telehealth: Payer: Self-pay | Admitting: *Deleted

## 2013-06-15 NOTE — Telephone Encounter (Signed)
Message copied by Thamas Jaegers on Wed Jun 15, 2013  9:18 AM ------      Message from: [name]      Created: Tue Jun 14, 2013  5:07 PM       Arrange genetic counseling at [facility] cancer reference strong family history of breast cancer, ovarian cancer and colon cancer. ------

## 2013-06-15 NOTE — Telephone Encounter (Signed)
Referral faxed to [facility], they will contact patient. 

## 2013-06-16 ENCOUNTER — Encounter: Payer: Self-pay | Admitting: Gynecology

## 2013-06-16 ENCOUNTER — Other Ambulatory Visit: Payer: Self-pay | Admitting: Gynecology

## 2013-06-16 ENCOUNTER — Ambulatory Visit (INDEPENDENT_AMBULATORY_CARE_PROVIDER_SITE_OTHER): Payer: BC Managed Care – PPO | Admitting: Gynecology

## 2013-06-16 ENCOUNTER — Ambulatory Visit (INDEPENDENT_AMBULATORY_CARE_PROVIDER_SITE_OTHER): Payer: BC Managed Care – PPO

## 2013-06-16 DIAGNOSIS — N83339 Acquired atrophy of ovary and fallopian tube, unspecified side: Secondary | ICD-10-CM

## 2013-06-16 DIAGNOSIS — Z79890 Hormone replacement therapy (postmenopausal): Secondary | ICD-10-CM

## 2013-06-16 DIAGNOSIS — D252 Subserosal leiomyoma of uterus: Secondary | ICD-10-CM

## 2013-06-16 DIAGNOSIS — Z8041 Family history of malignant neoplasm of ovary: Secondary | ICD-10-CM

## 2013-06-16 DIAGNOSIS — Z803 Family history of malignant neoplasm of breast: Secondary | ICD-10-CM

## 2013-06-16 DIAGNOSIS — D251 Intramural leiomyoma of uterus: Secondary | ICD-10-CM

## 2013-06-16 NOTE — Progress Notes (Signed)
Chelsey Shepherd 1955-02-15 [id number]        58 y.o.  G2P0020 presents for ultrasound strong family history of ovarian cancer and breast cancer. Also history of leiomyoma. On HRT.  Past medical history,surgical history, problem list, medications, allergies, family history and social history were all reviewed and documented in the EPIC chart.  Directed ROS with pertinent positives and negatives documented in the history of present illness/assessment and plan.  Ultrasound shows uterus normal size. Small 12 mm myoma noted. Endometrial echo 2.6 mm. Right and left ovaries visualized and atrophic. Cul-de-sac negative.  Assessment/Plan:  58 y.o. G2P0020 with history as noted above. Ultrasound reassuring with atrophic appearing ovaries, thin endometrial echo and a small myoma. Patient is in the process of arranging her appointment with the genetic counselor at to review genetic testing given her cancer history. She knows to call our office if she does not hear from [place] within the next one to 2 weeks.   Note: This document was prepared with digital dictation and possible smart phrase technology. Any transcriptional errors that result from this process are unintentional.   [name] MD, 3:06 PM 06/16/2013

## 2013-06-16 NOTE — Patient Instructions (Signed)
Office will contact you to arrange the appointment with the genetic counselor. If you do not hear from the office in one to 2 weeks call the office.

## 2013-06-27 NOTE — Telephone Encounter (Signed)
Appointment 08/03/13 @ 2:30 pm

## 2013-08-03 ENCOUNTER — Other Ambulatory Visit: Payer: BC Managed Care – PPO

## 2013-09-04 IMAGING — NM NM HEPATO W/GB/PHARM/[PERSON_NAME]
2 series · 12 of 12 positions shown · non-contrast
Comparison: Ultrasound 05/24/2012

CLINICAL DATA: Abdominal pain.

NUCLEAR MEDICINE HEPATOBILIARY IMAGING WITH GALLBLADDER EF
TECHNIQUE: Sequential images of the abdomen were obtained [DATE]
minutes following intravenous administration of
radiopharmaceutical.  After slow intravenous infusion of 5.87ucg
Cholecystokinin, gallbladder ejection fraction was determined.
Radiopharmaceutical:  6.4mFi 1c-UUm Choletec

[Series 0: hepatobiliary · 3.20mm/px · 6 of 31 frames shown (1 of 2)]
[frame 3/31]
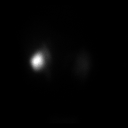
[frame 8/31]
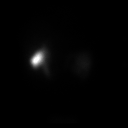
[frame 13/31]
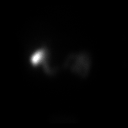
[frame 18/31]
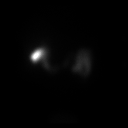
[frame 23/31]
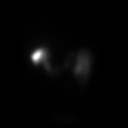
[frame 29/31]
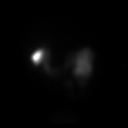

[Series 0: hepatobiliary · 3.20mm/px · 6 of 57 frames shown (2 of 2)]
[frame 5/57]
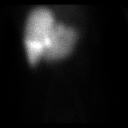
[frame 15/57]
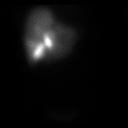
[frame 24/57]
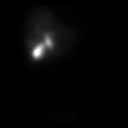
[frame 34/57]
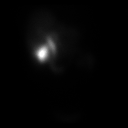
[frame 43/57]
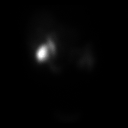
[frame 53/57]
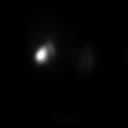

[12 of 12 positions shown; findings below may reference images not displayed]

FINDINGS: There is prompt uptake and excretion of radiotracer by
the liver.  No evidence of cystic duct or common duct obstruction.
Gallbladder ejection fraction is 50%.  Normal is greater than 30%.

The patient did experience symptoms during CCK infusion.
IMPRESSION: Normal gallbladder ejection fraction.  The patient experienced pain
with CCK administration.

## 2013-12-12 ENCOUNTER — Encounter: Payer: Self-pay | Admitting: Gynecology

## 2014-02-10 DIAGNOSIS — Z1501 Genetic susceptibility to malignant neoplasm of breast: Secondary | ICD-10-CM

## 2014-02-10 HISTORY — DX: Genetic susceptibility to malignant neoplasm of breast: Z15.01

## 2014-05-31 ENCOUNTER — Encounter: Payer: Self-pay | Admitting: Gynecology

## 2014-06-20 ENCOUNTER — Other Ambulatory Visit: Payer: Self-pay | Admitting: Gynecology

## 2014-08-03 ENCOUNTER — Encounter: Payer: Self-pay | Admitting: Gynecology

## 2014-08-09 ENCOUNTER — Encounter: Payer: Self-pay | Admitting: Gynecology

## 2014-08-09 ENCOUNTER — Telehealth: Payer: Self-pay | Admitting: *Deleted

## 2014-08-09 ENCOUNTER — Ambulatory Visit (INDEPENDENT_AMBULATORY_CARE_PROVIDER_SITE_OTHER): Payer: BLUE CROSS/BLUE SHIELD | Admitting: Gynecology

## 2014-08-09 ENCOUNTER — Other Ambulatory Visit (HOSPITAL_COMMUNITY)
Admission: RE | Admit: 2014-08-09 | Discharge: 2014-08-09 | Disposition: A | Discharge status: Home / Self Care | Payer: BLUE CROSS/BLUE SHIELD | Source: Ambulatory Visit | Attending: Gynecology | Admitting: Gynecology

## 2014-08-09 VITALS — BP 120/74 | Ht 64.5 in | Wt 126.0 lb

## 2014-08-09 DIAGNOSIS — Z01419 Encounter for gynecological examination (general) (routine) without abnormal findings: Secondary | ICD-10-CM

## 2014-08-09 DIAGNOSIS — Z79890 Hormone replacement therapy (postmenopausal): Secondary | ICD-10-CM

## 2014-08-09 DIAGNOSIS — Z8041 Family history of malignant neoplasm of ovary: Secondary | ICD-10-CM | POA: Diagnosis not present

## 2014-08-09 DIAGNOSIS — Z1151 Encounter for screening for human papillomavirus (HPV): Secondary | ICD-10-CM | POA: Insufficient documentation

## 2014-08-09 LAB — COMPREHENSIVE METABOLIC PANEL
ALT: 16 U/L (ref 0–35)
AST: 17 U/L (ref 0–37)
Albumin: 4.1 g/dL (ref 3.5–5.2)
Alkaline Phosphatase: 38 U/L — ABNORMAL LOW (ref 39–117)
BUN: 18 mg/dL (ref 6–23)
CALCIUM: 9.2 mg/dL (ref 8.4–10.5)
CO2: 27 meq/L (ref 19–32)
Chloride: 103 mEq/L (ref 96–112)
Creat: 0.92 mg/dL (ref 0.50–1.10)
Glucose, Bld: 88 mg/dL (ref 70–99)
Potassium: 4.4 mEq/L (ref 3.5–5.3)
Sodium: 139 mEq/L (ref 135–145)
TOTAL PROTEIN: 6.9 g/dL (ref 6.0–8.3)
Total Bilirubin: 0.6 mg/dL (ref 0.2–1.2)

## 2014-08-09 LAB — CBC WITH DIFFERENTIAL/PLATELET
Basophils Absolute: 0 10*3/uL (ref 0.0–0.1)
Basophils Relative: 0 % (ref 0–1)
Eosinophils Absolute: 0.3 10*3/uL (ref 0.0–0.7)
Eosinophils Relative: 4 % (ref 0–5)
HCT: 41.5 % (ref 36.0–46.0)
Hemoglobin: 14.4 g/dL (ref 12.0–15.0)
Lymphocytes Relative: 16 % (ref 12–46)
Lymphs Abs: 1.1 10*3/uL (ref 0.7–4.0)
MCH: 31.9 pg (ref 26.0–34.0)
MCHC: 34.7 g/dL (ref 30.0–36.0)
MCV: 92 fL (ref 78.0–100.0)
MONO ABS: 0.3 10*3/uL (ref 0.1–1.0)
MONOS PCT: 4 % (ref 3–12)
MPV: 9.7 fL (ref 8.6–12.4)
Neutro Abs: 5.3 10*3/uL (ref 1.7–7.7)
Neutrophils Relative %: 76 % (ref 43–77)
Platelets: 219 10*3/uL (ref 150–400)
RBC: 4.51 MIL/uL (ref 3.87–5.11)
RDW: 13.6 % (ref 11.5–15.5)
WBC: 7 10*3/uL (ref 4.0–10.5)

## 2014-08-09 LAB — TSH: TSH: 2.112 u[IU]/mL (ref 0.350–4.500)

## 2014-08-09 LAB — LIPID PANEL
Cholesterol: 165 mg/dL (ref 0–200)
HDL: 84 mg/dL (ref >=46)
LDL Cholesterol: 71 mg/dL (ref 0–99)
Total CHOL/HDL Ratio: 2 Ratio
Triglycerides: 51 mg/dL (ref <150)
VLDL: 10 mg/dL (ref 0–40)

## 2014-08-09 MED ORDER — ESTRADIOL 0.5 MG PO TABS: 0.5000 mg | ORAL_TABLET | Freq: Every day | ORAL | Status: DC

## 2014-08-09 MED ORDER — PROGESTERONE MICRONIZED 100 MG PO CAPS: ORAL_CAPSULE | ORAL | Status: DC

## 2014-08-09 NOTE — Telephone Encounter (Signed)
-----   Message from [name], MD sent at 08/09/2014  8:23 AM EDT ----- Schedule oncology genetic counseling appointment at [facility] reference family history of breast and ovarian cancer

## 2014-08-09 NOTE — Progress Notes (Signed)
Chelsey Shepherd 11-02-1955 [id number]        59 y.o.  G2P0020 for annual exam.  Several issues noted below.  Past medical history,surgical history, problem list, medications, allergies, family history and social history were all reviewed and documented as reviewed in the EPIC chart.  ROS:  Performed with pertinent positives and negatives included in the history, assessment and plan.   Additional significant findings :  none   Exam: Kim Counsellor Vitals:   08/09/14 0803  BP: 120/74  Height: 5' 4.5" (1.638 m)  Weight: 126 lb (57.153 kg)   General appearance:  Normal affect, orientation and appearance. Skin: Grossly normal HEENT: Without gross lesions.  No cervical or supraclavicular adenopathy. Thyroid normal.  Lungs:  Clear without wheezing, rales or rhonchi Cardiac: RR, without RMG Abdominal:  Soft, nontender, without masses, guarding, rebound, organomegaly or hernia Breasts:  Examined lying and sitting without masses, retractions, discharge or axillary adenopathy.   Bilateral implants noted. Pelvic:  Ext/BUS/vagina normal with mild atrophic changes  Cervix normal with mild atrophic changes. Pap/HPV  Uterus anteverted, normal size, shape and contour, midline and mobile nontender   Adnexa  Without masses or tenderness    Anus and perineum  Normal   Rectovaginal  Normal sphincter tone without palpated masses or tenderness.    Assessment/Plan:  59 y.o. G27P0020 female for annual exam.   1. Postmenopausal/HRT. Patient continues on Estrace 0.5 mg and Prometrium 100 mg. Doing well with this. I again reviewed the whole issue of HRT and timing as far as when to stop this. Lowest dose per shortest period of time recommendations per ACOG and NAMS discussed. Increased risk of stroke heart attack DVT and breast cancer all reviewed. At this point patient wants to continue and I refilled her 1 year. 2. Strong family history of ovarian cancer on her father's side and breast cancer on her  mother's side as well as colon cancer in her mother's side. Arranged for genetic counseling last year but she canceled her appointment because she was "busy". I again discussed the whole issue of genetic counseling and if positive up to 80% risk of breast cancer, 20-40% risk of ovarian cancer and possible increased risks for other cancers such as uterine and GI. I again strongly recommended that she pursue genetic counseling but only for possible gene blood testing but also breast cancer risk calculation and possible increased surveillance with MRI or other modalities. Patient agrees to this and we will help her arrange this. She knows to call if she does not hear about the appointment in 2 weeks.  We also discussed the issues of surveillance testing such as CA 125 and ultrasounds. She's had ultrasounds done before but has declined CA-125. Again reviewed the issues of false positive and false negatives and the patient would like an ultrasound but declined CA-125. Patient will follow up for this 3. Mammography 05/2014. Continue with annual mammography. SBE monthly reviewed. 4. Pap smear 2012. Pap smear/HPV done today. No history of significant abnormal Pap smears. 5. Colonoscopy 2014. Repeat at their recommended interval. 6. DEXA 2012 normal. Plan repeat at age 74. Increase calcium vitamin D reviewed. Check vitamin D level today. 7. Health maintenance. Patient requests baseline lab work. She does have a primary physician. CBC, comprehensive metabolic panel, lipid profile, urinalysis, TSH, vitamin D ordered. Follow up for ultrasound and genetic counseling otherwise annual exam in one year.   [name] MD, 8:24 AM 08/09/2014

## 2014-08-09 NOTE — Patient Instructions (Signed)
Office will call you to arrange the genetic counseling in reference to your family history of breast cancer and ovarian cancer  Follow up ultrasound as scheduled.  You may obtain a copy of any labs that were done today by logging onto MyChart as outlined in the instructions provided with your AVS (after visit summary). The office will not call with normal lab results but certainly if there are any significant abnormalities then we will contact you.   Health Maintenance, Female A healthy lifestyle and preventative care can promote health and wellness.  Maintain regular health, dental, and eye exams.  Eat a healthy diet. Foods like vegetables, fruits, whole grains, low-fat dairy products, and lean protein foods contain the nutrients you need without too many calories. Decrease your intake of foods high in solid fats, added sugars, and salt. Get information about a proper diet from your caregiver, if necessary.  Regular physical exercise is one of the most important things you can do for your health. Most adults should get at least 150 minutes of moderate-intensity exercise (any activity that increases your heart rate and causes you to sweat) each week. In addition, most adults need muscle-strengthening exercises on 2 or more days a week.   Maintain a healthy weight. The body mass index (BMI) is a screening tool to identify possible weight problems. It provides an estimate of body fat based on height and weight. Your caregiver can help determine your BMI, and can help you achieve or maintain a healthy weight. For adults 20 years and older:  A BMI below 18.5 is considered underweight.  A BMI of 18.5 to 24.9 is normal.  A BMI of 25 to 29.9 is considered overweight.  A BMI of 30 and above is considered obese.  Maintain normal blood lipids and cholesterol by exercising and minimizing your intake of saturated fat. Eat a balanced diet with plenty of fruits and vegetables. Blood tests for lipids and  cholesterol should begin at age 64 and be repeated every 5 years. If your lipid or cholesterol levels are high, you are over 50, or you are a high risk for heart disease, you may need your cholesterol levels checked more frequently.Ongoing high lipid and cholesterol levels should be treated with medicines if diet and exercise are not effective.  If you smoke, find out from your caregiver how to quit. If you do not use tobacco, do not start.  Lung cancer screening is recommended for adults aged 2 80 years who are at high risk for developing lung cancer because of a history of smoking. Yearly low-dose computed tomography (CT) is recommended for people who have at least a 30-pack-year history of smoking and are a current smoker or have quit within the past 15 years. A pack year of smoking is smoking an average of 1 pack of cigarettes a day for 1 year (for example: 1 pack a day for 30 years or 2 packs a day for 15 years). Yearly screening should continue until the smoker has stopped smoking for at least 15 years. Yearly screening should also be stopped for people who develop a health problem that would prevent them from having lung cancer treatment.  If you are pregnant, do not drink alcohol. If you are breastfeeding, be very cautious about drinking alcohol. If you are not pregnant and choose to drink alcohol, do not exceed 1 drink per day. One drink is considered to be 12 ounces (355 mL) of beer, 5 ounces (148 mL) of wine, or 1.5  ounces (44 mL) of liquor.  Avoid use of street drugs. Do not share needles with anyone. Ask for help if you need support or instructions about stopping the use of drugs.  High blood pressure causes heart disease and increases the risk of stroke. Blood pressure should be checked at least every 1 to 2 years. Ongoing high blood pressure should be treated with medicines, if weight loss and exercise are not effective.  If you are 55 to 58 years old, ask your caregiver if you should  take aspirin to prevent strokes.  Diabetes screening involves taking a blood sample to check your fasting blood sugar level. This should be done once every 3 years, after age 45, if you are within normal weight and without risk factors for diabetes. Testing should be considered at a younger age or be carried out more frequently if you are overweight and have at least 1 risk factor for diabetes.  Breast cancer screening is essential preventative care for women. You should practice "breast self-awareness." This means understanding the normal appearance and feel of your breasts and may include breast self-examination. Any changes detected, no matter how small, should be reported to a caregiver. Women in their 20s and 30s should have a clinical breast exam (CBE) by a caregiver as part of a regular health exam every 1 to 3 years. After age 40, women should have a CBE every year. Starting at age 40, women should consider having a mammogram (breast X-ray) every year. Women who have a family history of breast cancer should talk to their caregiver about genetic screening. Women at a high risk of breast cancer should talk to their caregiver about having an MRI and a mammogram every year.  Breast cancer gene (BRCA)-related cancer risk assessment is recommended for women who have family members with BRCA-related cancers. BRCA-related cancers include breast, ovarian, tubal, and peritoneal cancers. Having family members with these cancers may be associated with an increased risk for harmful changes (mutations) in the breast cancer genes BRCA1 and BRCA2. Results of the assessment will determine the need for genetic counseling and BRCA1 and BRCA2 testing.  The Pap test is a screening test for cervical cancer. Women should have a Pap test starting at age 21. Between ages 21 and 29, Pap tests should be repeated every 2 years. Beginning at age 30, you should have a Pap test every 3 years as long as the past 3 Pap tests have  been normal. If you had a hysterectomy for a problem that was not cancer or a condition that could lead to cancer, then you no longer need Pap tests. If you are between ages 65 and 70, and you have had normal Pap tests going back 10 years, you no longer need Pap tests. If you have had past treatment for cervical cancer or a condition that could lead to cancer, you need Pap tests and screening for cancer for at least 20 years after your treatment. If Pap tests have been discontinued, risk factors (such as a new sexual partner) need to be reassessed to determine if screening should be resumed. Some women have medical problems that increase the chance of getting cervical cancer. In these cases, your caregiver may recommend more frequent screening and Pap tests.  The human papillomavirus (HPV) test is an additional test that may be used for cervical cancer screening. The HPV test looks for the virus that can cause the cell changes on the cervix. The cells collected during the Pap test   can be tested for HPV. The HPV test could be used to screen women aged 58 years and older, and should be used in women of any age who have unclear Pap test results. After the age of 45, women should have HPV testing at the same frequency as a Pap test.  Colorectal cancer can be detected and often prevented. Most routine colorectal cancer screening begins at the age of 49 and continues through age 34. However, your caregiver may recommend screening at an earlier age if you have risk factors for colon cancer. On a yearly basis, your caregiver may provide home test kits to check for hidden blood in the stool. Use of a small camera at the end of a tube, to directly examine the colon (sigmoidoscopy or colonoscopy), can detect the earliest forms of colorectal cancer. Talk to your caregiver about this at age 24, when routine screening begins. Direct examination of the colon should be repeated every 5 to 10 years through age 74, unless early  forms of pre-cancerous polyps or small growths are found.  Hepatitis C blood testing is recommended for all people born from [birth year] through 1964 and any individual with known risks for hepatitis C.  Practice safe sex. Use condoms and avoid high-risk sexual practices to reduce the spread of sexually transmitted infections (STIs). Sexually active women aged 39 and younger should be checked for Chlamydia, which is a common sexually transmitted infection. Older women with new or multiple partners should also be tested for Chlamydia. Testing for other STIs is recommended if you are sexually active and at increased risk.  Osteoporosis is a disease in which the bones lose minerals and strength with aging. This can result in serious bone fractures. The risk of osteoporosis can be identified using a bone density scan. Women ages 48 and over and women at risk for fractures or osteoporosis should discuss screening with their caregivers. Ask your caregiver whether you should be taking a calcium supplement or vitamin D to reduce the rate of osteoporosis.  Menopause can be associated with physical symptoms and risks. Hormone replacement therapy is available to decrease symptoms and risks. You should talk to your caregiver about whether hormone replacement therapy is right for you.  Use sunscreen. Apply sunscreen liberally and repeatedly throughout the day. You should seek shade when your shadow is shorter than you. Protect yourself by wearing long sleeves, pants, a wide-brimmed hat, and sunglasses year round, whenever you are outdoors.  Notify your caregiver of new moles or changes in moles, especially if there is a change in shape or color. Also notify your caregiver if a mole is larger than the size of a pencil eraser.  Stay current with your immunizations. Document Released: 08/12/2010 Document Revised: 05/24/2012 Document Reviewed: 08/12/2010 [facility] Patient Information 2014 [place].

## 2014-08-09 NOTE — Addendum Note (Signed)
Addended by: Nelva Nay on: 08/09/2014 08:34 AM   Modules accepted: Orders

## 2014-08-09 NOTE — Telephone Encounter (Signed)
Referral faxed to cancer center, they will contact pt to schedule.

## 2014-08-10 LAB — URINALYSIS W MICROSCOPIC + REFLEX CULTURE
BACTERIA UA: NONE SEEN
BILIRUBIN URINE: NEGATIVE
Casts: NONE SEEN
Crystals: NONE SEEN
Glucose, UA: NEGATIVE mg/dL
Hgb urine dipstick: NEGATIVE
Ketones, ur: NEGATIVE mg/dL
LEUKOCYTES UA: NEGATIVE
Nitrite: NEGATIVE
PH: 5 (ref 5.0–8.0)
Protein, ur: NEGATIVE mg/dL
SQUAMOUS EPITHELIAL / LPF: NONE SEEN
Specific Gravity, Urine: 1.018 (ref 1.005–1.030)
UROBILINOGEN UA: 0.2 mg/dL (ref 0.0–1.0)

## 2014-08-10 LAB — CYTOLOGY - PAP

## 2014-08-10 LAB — VITAMIN D 25 HYDROXY (VIT D DEFICIENCY, FRACTURES): Vit D, 25-Hydroxy: 34 ng/mL (ref 30–100)

## 2014-08-16 ENCOUNTER — Telehealth: Payer: Self-pay | Admitting: Genetic Counselor

## 2014-08-16 NOTE — Telephone Encounter (Signed)
Chelsey Shepherd 08/24/14 2pm  Dx: genetic counseling/breast cancer, ovarian cancer Referring: Dr. Phineas Real

## 2014-08-23 NOTE — Telephone Encounter (Signed)
appointment on 08/24/14 @ 3:00pm

## 2014-08-24 ENCOUNTER — Other Ambulatory Visit: Payer: BLUE CROSS/BLUE SHIELD

## 2014-08-24 ENCOUNTER — Ambulatory Visit (HOSPITAL_BASED_OUTPATIENT_CLINIC_OR_DEPARTMENT_OTHER): Payer: BLUE CROSS/BLUE SHIELD | Admitting: Genetic Counselor

## 2014-08-24 ENCOUNTER — Encounter: Payer: Self-pay | Admitting: Genetic Counselor

## 2014-08-24 DIAGNOSIS — Z315 Encounter for genetic counseling: Secondary | ICD-10-CM | POA: Diagnosis not present

## 2014-08-24 DIAGNOSIS — Z8041 Family history of malignant neoplasm of ovary: Secondary | ICD-10-CM | POA: Diagnosis not present

## 2014-08-24 DIAGNOSIS — Z803 Family history of malignant neoplasm of breast: Secondary | ICD-10-CM | POA: Diagnosis not present

## 2014-08-24 DIAGNOSIS — Z800 Family history of malignant neoplasm of digestive organs: Secondary | ICD-10-CM

## 2014-08-24 DIAGNOSIS — Z806 Family history of leukemia: Secondary | ICD-10-CM

## 2014-08-24 DIAGNOSIS — Z809 Family history of malignant neoplasm, unspecified: Secondary | ICD-10-CM

## 2014-08-24 NOTE — Progress Notes (Signed)
REFERRING PROVIDER: Anastasio Shepherd., MD PRIMARY PROVIDER:  Vena Austria, MD  PRIMARY REASON FOR VISIT:  1. Family history of ovarian cancer   2. Family history of breast cancer in female   3. Family history of colon cancer   4. Family history of cancer   5. Family history of leukemia      HISTORY OF PRESENT ILLNESS:   Chelsey Shepherd, a 59 y.o. female, was seen for a  cancer genetics consultation at the request of Dr. Alyson Shepherd due to a paternal family history of ovarian cancer and maternal family history of breast cancer.  Chelsey Shepherd presents to clinic today to discuss the possibility of a hereditary predisposition to cancer, genetic testing, and to further clarify her future cancer risks, as well as potential cancer risks for family members.   Chelsey Shepherd is a 59 y.o. female with no personal history of cancer.  She has a history of uterine fibroids.  CANCER HISTORY:   No history exists.  History of uterine fibroids.   HORMONAL RISK FACTORS:  Menarche was at age 17.  First live birth at age - no children.  OCP use for approximately 20 years total - pills and IUD. Ovaries intact: yes.  Hysterectomy: no.  Menopausal status: postmenopausal.  HRT use: (currently using) 3 years - estradiol and prometrium Colonoscopy: yes; one polyp found in 2009; normal/no polyps in 2014. Mammogram within the last year: yes. Number of breast biopsies: 0. Up to date with pelvic exams:  yes. Any excessive radiation exposure in the past:  no  Past Medical History  Diagnosis Date  . Hypercholesteremia   . Psoriasis   . Anxiety   . Fibroid     Past Surgical History  Procedure Laterality Date  . Bartholin gland cyst excision      X 2  . Dilation and curettage of uterus      MISSED AB  . Augmentation mammaplasty  09/1986  . Hysteroscopy  2012    D&C, HYSTEROSCOPY    History   Social History  . Marital Status: Married    Spouse Name: N/A  . Number of Children: N/A  .  Years of Education: N/A   Social History Main Topics  . Smoking status: Former Smoker -- 1.00 packs/day for 22 years    Types: Cigarettes    Quit date: 11/12/1999  . Smokeless tobacco: Never Used  . Alcohol Use: 4.2 oz/week    7 Standard drinks or equivalent per week  . Drug Use: No  . Sexual Activity: Yes    Birth Control/ Protection: Post-menopausal     Comment: 1st intercourse 59 yo-More than 5 partners   Other Topics Concern  . None   Social History Narrative     FAMILY HISTORY:  We obtained a detailed, 4-generation family history.  Significant diagnoses are listed below: Family History  Problem Relation Age of Onset  . Heart disease Father   . Heart attack Father   . Breast cancer Maternal Aunt     dx. early 67s; used HRT  . Ovarian cancer Paternal Aunt 64    "negative BRCA testing"  . Ovarian cancer Paternal Grandmother 9    also PGGM  . Breast cancer Maternal Aunt 74    used HRT  . Breast cancer Maternal Aunt     dx. 60s; used HRT  . Throat cancer Maternal Uncle 56    smoker  . Colon cancer Maternal Grandmother 98  . Emphysema Maternal Grandfather   .  Congestive Heart Failure Paternal Grandfather   . Lung cancer Maternal Aunt     smoker  . Cancer Maternal Aunt     dx. 44s; unknown cancer, maybe colon  . Breast cancer Cousin     dx. early 41s  . Breast cancer Cousin     dx. late 40s-early 15s  . Ovarian cancer Other   . Cancer Cousin     unknown type; dx. 26-27  . Leukemia Cousin     dx. childhood   Chelsey Shepherd has one brother, age 70, who is cancer-free, but who has not had a colonoscopy.  She has one sister who is 88 and cancer-free; this sister is also not currently getting colonoscopies but has had normal mammogram screenings.  Chelsey Shepherd has no children, and has one niece, age 80.  Chelsey Shepherd's mother is currently 51 and has not had cancer.  Her father died of a heart attack at 76.  There is a paternal family history of ovarian cancer in one aunt  (of three aunts total) diagnosed at 35; this aunt has reportedly had "negative BRCA testing".  Chelsey Shepherd's paternal grandmother was diagnosed with ovarian cancer at 55, and the mother of this grandmother was also diagnosed with ovarian cancer.  Chelsey Shepherd's paternal grandfather died of congestive heart failure at 48.  Chelsey Shepherd is not aware of any cancer diagnoses for any paternal cousins.    There is a maternal family history of breast cancer in three maternal aunts (of seven aunts)--diagnosed in their early 25s-70s.  All three of these aunts had a history of taking hormone replacement therapy.  Another maternal aunt was diagnosed with an unknown cancer (potentially colon cancer) in her 72s and underwent radiation in treatment for this.  Chelsey Shepherd has two maternal uncles--one of whom was a smoker and was diagnosed with throat cancer at 58 and the other of whom passed away in his late 7s-early 30s.  Two maternal first cousins have had breast cancer--one diagnosed in her late 64s-early 50s and the other diagnosed in her early 72s.  A third maternal first cousin was diagnosed with an unknown cancer and passed away around the age of 12.  A maternal first cousin once-removed was diagnosed with a childhood-onset leukemia.  Chelsey Shepherd's maternal grandmother died of colon cancer at the age of 63.  Her maternal grandfather died at 66 related to emphysema.  To Chelsey Shepherd's knowledge, no relatives on this side of the family have had any kind of genetic testing.  Patient's maternal ancestors are of [place] descent, and paternal ancestors are of [place] and [place] descent. There is no reported Ashkenazi Jewish ancestry. There is no known consanguinity.  GENETIC COUNSELING ASSESSMENT: Chelsey Shepherd is a 59 y.o. female with a paternal family history of ovarian cancer and a maternal family history of breast cancer which is somewhat suggestive of a hereditary breast and/or ovarian cancer syndrome and predisposition to  cancer. We, therefore, discussed and recommended the following at today's visit.   DISCUSSION: We reviewed the characteristics, features and inheritance patterns of hereditary cancer syndromes, particularly those associated with changes within the BRCA1/2 genes and the Lynch syndrome genes. We also discussed genetic testing, including the appropriate family members to test, the process of testing, insurance coverage and turn-around-time for results. We discussed that it would be helpful for Chelsey Shepherd to reach out to her paternal aunt to obtain a copy of her genetic testing report.  This will help [place] to know  what exactly this aunt was tested for, if she needs any further testing, and whether or not that test result was truly negative.  We further discussed that a negative test result for Chelsey Shepherd may be less informative since she has not had cancer herself.  Thus, a negative result for her would not rule out a genetic cause for the family history of cancer because there would still be a chance that Chelsey Shepherd just did not inherit any genetic change.  Chelsey Shepherd is aware of this and would still like to proceed with genetic testing today.  She will reach out to her aunt in the meantime.  We discussed the implications of a negative, positive and/or variant of uncertain significant result. We recommended Chelsey Shepherd pursue genetic testing for the 24-gene OvaNext Panel [facility] [place]   Based on Chelsey Shepherd's family history of cancer, she meets medical criteria for genetic testing. Despite that she meets criteria, she may still have an out of pocket cost. We discussed that if her out of pocket cost for testing is over $100, the laboratory will call and confirm whether she wants to proceed with testing.  If the out of pocket cost of testing is less than $100 she will be billed by the genetic testing laboratory.  In the event that her out-of-pocket cost would be high, however, Ms.  Kinser can contact me and we can either discuss cheaper testing options or discuss the option of having a relative affected with cancer get genetic testing first.  Based on the patient's personal and family history, statistical model (Tyrer-Cuzick)  and literature data were used to estimate her risk of developing breast cancer. These estimate her lifetime risk of developing breast cancer to be approximately 19.0%. This estimation does not take into account any genetic testing results.  The patient's lifetime breast cancer risk is a preliminary estimate based on available information using one of several models endorsed by the [place] (ACS). The ACS recommends consideration of breast MRI screening as an adjunct to mammography for patients at high risk (defined as 20% or greater lifetime risk). A more detailed breast cancer risk assessment can be considered, if clinically indicated.   PLAN: After considering the risks, benefits, and limitations, Ms. Cadet  provided informed consent to pursue genetic testing and the blood sample was sent to [facility] for analysis of the 24-gene OvaNext Panel test. The OvaNext panel includes sequencing and deletion/duplication analysis for the following 23 genes: ATM, BARD1, BRCA1, BRCA2, BRIP1, CDH1, CHEK2, MLH1, MRE11A, MSH2, MSH6, MUTYH, NBN, NF1, PALB2, PMS2, PTEN, RAD50, RAD51C, RAD51D, SMARCA4, STK11, and TP53.  This panel also includes deletion/duplication analysis (without sequencing) for one gene, EPCAM.  Results should be available within approximately 3-4 weeks' time, at which point they will be disclosed by telephone to Ms. Mullinax, as will any additional recommendations warranted by these results. Ms. Schmiesing will receive a summary of her genetic counseling visit and a copy of her results once available. This information will also be available in Epic. We encouraged Ms. Gnau to remain in contact with cancer genetics annually so that  we can continuously update the family history and inform her of any changes in cancer genetics and testing that may be of benefit for her family. Ms. Schooler's questions were answered to her satisfaction today. Our contact information was provided should additional questions or concerns arise.  Thank you for the referral and allowing [place] to share in  the care of your patient.   Jeanine Luz, MS Genetic Counselor [email or web address] Phone: [phone]  The patient was seen for a total of 60 minutes in face-to-face genetic counseling.  This patient was discussed with Drs. Magrinat, Lindi Adie and/or Burr Medico who agrees with the above.    _______________________________________________________________________ For Office Staff:  Number of people involved in session: 1 Was an Intern/ student involved with case: no

## 2014-08-25 ENCOUNTER — Ambulatory Visit: Payer: BLUE CROSS/BLUE SHIELD | Admitting: Gynecology

## 2014-08-25 ENCOUNTER — Other Ambulatory Visit: Payer: BLUE CROSS/BLUE SHIELD

## 2014-09-07 ENCOUNTER — Ambulatory Visit (INDEPENDENT_AMBULATORY_CARE_PROVIDER_SITE_OTHER): Payer: BLUE CROSS/BLUE SHIELD

## 2014-09-07 ENCOUNTER — Ambulatory Visit (INDEPENDENT_AMBULATORY_CARE_PROVIDER_SITE_OTHER): Payer: BLUE CROSS/BLUE SHIELD | Admitting: Gynecology

## 2014-09-07 ENCOUNTER — Encounter: Payer: Self-pay | Admitting: Gynecology

## 2014-09-07 ENCOUNTER — Other Ambulatory Visit: Payer: Self-pay | Admitting: Gynecology

## 2014-09-07 VITALS — BP 118/76

## 2014-09-07 DIAGNOSIS — N83319 Acquired atrophy of ovary, unspecified side: Secondary | ICD-10-CM

## 2014-09-07 DIAGNOSIS — N8331 Acquired atrophy of ovary: Secondary | ICD-10-CM | POA: Diagnosis not present

## 2014-09-07 DIAGNOSIS — Z8041 Family history of malignant neoplasm of ovary: Secondary | ICD-10-CM

## 2014-09-07 DIAGNOSIS — D251 Intramural leiomyoma of uterus: Secondary | ICD-10-CM | POA: Diagnosis not present

## 2014-09-07 DIAGNOSIS — D252 Subserosal leiomyoma of uterus: Secondary | ICD-10-CM

## 2014-09-07 NOTE — Progress Notes (Signed)
Chelsey Shepherd 10-05-1955 [id number]        59 y.o.  G2P0020 Presents for ultrasound due to her very strong family history ofovarian cancer and breast cancer. She did ultimately see the genetic counselors and had genetic testing drawn.  Past medical history,surgical history, problem list, medications, allergies, family history and social history were all reviewed and documented in the EPIC chart.  Directed ROS with pertinent positives and negatives documented in the history of present illness/assessment and plan.  Exam: Filed Vitals:   09/07/14 1427  BP: 118/76   General appearance:  Normal  Ultrasound shows uterus normal size with several small myomas 12 mm and 9 mm. Endometrial echo 2.5 mm. Right and left ovaries visualized and atrophic. Cul-de-sac negative.  Assessment/Plan:  59 y.o. G2P0020 with normal GYN ultrasound. Patient awaiting her genetic testing results. Will follow up with the genetic counselor for this. Will follow up with me in one year for annual exam, sooner as needed.    [name] MD, 2:48 PM 09/07/2014

## 2014-09-07 NOTE — Patient Instructions (Signed)
Follow up for your genetic testing results. Follow up for annual exam when due.

## 2014-09-14 ENCOUNTER — Other Ambulatory Visit: Payer: Self-pay | Admitting: Family Medicine

## 2014-09-14 ENCOUNTER — Ambulatory Visit
Admission: RE | Admit: 2014-09-14 | Discharge: 2014-09-14 | Disposition: A | Discharge status: Home / Self Care | Payer: BLUE CROSS/BLUE SHIELD | Source: Ambulatory Visit | Attending: Family Medicine | Admitting: Family Medicine

## 2014-09-14 DIAGNOSIS — R05 Cough: Secondary | ICD-10-CM

## 2014-09-14 DIAGNOSIS — R059 Cough, unspecified: Secondary | ICD-10-CM

## 2014-09-15 ENCOUNTER — Telehealth: Payer: Self-pay | Admitting: Genetic Counselor

## 2014-09-18 ENCOUNTER — Encounter: Payer: Self-pay | Admitting: Genetic Counselor

## 2014-09-18 DIAGNOSIS — Z1379 Encounter for other screening for genetic and chromosomal anomalies: Secondary | ICD-10-CM | POA: Insufficient documentation

## 2014-09-18 NOTE — Telephone Encounter (Signed)
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  We discussed that this means that Chelsey Shepherd is likely at an increased risk for breast cancer.  NBN is a newer gene, so we are still learning more about mutations in this gene and their associated cancer risks.  This particular change which Chelsey Shepherd carries has never been seen before, but a similar change has been found and that change was previously determined to be pathogenic for causing an increased risk for breast cancer.  Thus, the lab is calling this a likely pathogenic mutation based on that previous finding, though this particular mutation has never been seen before.  We discussed that changes within this NBN gene can cause women to be at an up to 30% chance of having breast cancer in their lifetimes.  There is also thought to be an increased risk for prostate cancer for men who are carriers of pathogenic mutations in this gene.  Currently, there are no guidelines for breast cancer screening for individuals who have one of these mutations.  Thus, cancer screening will still largely be based upon Chelsey Shepherd's family history of cancer.  She should talk to her primary care provider about this cancer history in light of this genetic test result, to determine whether she should be eligible for further screening such as annual breast MRI.  Currently Tyrer-Cuzick risk models have not shown her to be eligible, but this model does not take into account genetic risks beyond that of BRCA1/2 and, thus, is very limited.  We discussed at-risk family members and what side of the family this mutation may be coming from.  I will document these results and this information will be available in Chelsey Shepherd's medical record.  Additionally, I will reach out to [employer] to obtain their bibliography used to analyze this genetic result.  If these papers are helpful, I will pass them along to Chelsey Shepherd to  pass along to her doctors.

## 2014-09-22 ENCOUNTER — Ambulatory Visit: Payer: Self-pay | Admitting: Genetic Counselor

## 2014-09-22 DIAGNOSIS — Z803 Family history of malignant neoplasm of breast: Secondary | ICD-10-CM

## 2014-09-22 DIAGNOSIS — Z8041 Family history of malignant neoplasm of ovary: Secondary | ICD-10-CM

## 2014-09-22 DIAGNOSIS — Z800 Family history of malignant neoplasm of digestive organs: Secondary | ICD-10-CM

## 2014-09-22 DIAGNOSIS — Z1379 Encounter for other screening for genetic and chromosomal anomalies: Secondary | ICD-10-CM

## 2014-09-22 NOTE — Progress Notes (Signed)
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 Please refer to our previous note from August 24, 2014, for more information regarding Chelsey Shepherd's medical, family, and social histories.  Genetic Testing and Result: At the time of her visit, we recommended Chelsey Shepherd undergo genetic testing for the 24-gene OvaNext Panel through [facility].  The OvaNext panel includes sequencing and deletion/duplication analysis for the following 23 genes: ATM, BARD1, BRCA1, BRCA2, BRIP1, CDH1, CHEK2, MLH1, MRE11A, MSH2, MSH6, MUTYH, NBN, NF1, PALB2, PMS2, PTEN, RAD50, RAD51C, RAD51D, SMARCA4, STK11, and TP53.  This panel also includes deletion/duplication analysis (without sequencing) for one gene, EPCAM.  Those results are now back, the report date for which is September 13, 2014.    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  No additional mutations or any variants of uncertain significance (VUSes) were found.    Background Regarding the NBN gene: The normal function of the NBN gene is important in the repair of DNA that becomes damaged during normal DNA replication. If there is a specific change (mutation) in the NBN gene which stops the gene from working properly, this may inhibit the cell's ability to  correct mistakes in the DNA, thereby permitting more mutations to accumulate, which can increase an individual's risk to develop cancer in their lifetime.   Interpretation of Results: Peer-reviewed research suggests that women who have inherited a single mutation in the NBN gene may have an increased lifetime risk of developing breast cancer over that of a woman in the general population (general population lifetime risk for breast cancer is 12.5%), however data is still limited regarding the specific lifetime risks for cancer and the average age of onset of cancer. Clinical genetic testing for mutations in the NBN gene have only been available in the last several years in the context of hereditary cancer risk assessment and more research is needed to clarify the lifetime risks for cancers associated with inherited mutations in this gene.   Different mutations in the NBN gene may confer different lifetime risks for cancer. The available evidence is based on a limited number of studies and some studies have involved a small number of patients or patients from a specific ethnic group. There is one particular mutation in the NBN gene which is more common to individuals of [place] ancestry (this is not the mutation that was identified in Chelsey Shepherd) and some studies suggest this mutation is associated with a 2-3 fold risk for breast cancer (up to 30% lifetime risk) as compared to the general population (12.5%).  However, it is possible that the lifetime risk for breast cancer for women who carry this mutation in the NBN gene and also have a strong family history of early-onset breast cancers is greater than that of a carrier of the same mutation who has little or no family history of such  cancers. Thus, other genetic and/or environmental risk factors may modify the NBN gene. Men who have inherited an NBN mutation may be at a slightly increased risk for prostate cancer, but data is not conclusive and there are no  current recommendations for increased prostate cancer screening over that of the general population. Mutations in the NBN gene are not known to be associated with colon cancer risk. Further research about the NBN gene is expected to be available in the coming years, so recommendations may evolve.   There are currently no national guidelines available for the medical management of individuals who have inherited a single mutation in the NBN gene.  Recommendations for Cancer Screening: There are no current national guidelines to suggest increased breast surveillance in the form of annual breast MRI screening (in addition to annual mammography) for individuals who have a NBN mutation. However, if a person is calculated to have a greater than 20% lifetime risk to develop breast cancer based on family history information alone or other personal factors (such as certain types of benign breast disease, such as atypical duct hyperplasia (ADH), for example), breast MRI screening may be considered.   Based on the Chelsey Shepherd's personal and family history, the Tyrer-Cuzick risk model and literature data were used to estimate her risk of developing breast cancer. This model estimates her lifetime risk of developing breast cancer to be approximately 13.9%. This estimation is limited because it does not take into account any genetic testing results other than her BRCA1/2 negative status.  So it would not consider her NBN positive result.  The patient's lifetime breast cancer risk is a preliminary estimate based on available information using one of several models endorsed by the [place] (ACS). The ACS recommends consideration of breast MRI screening as an adjunct to mammography for patients at high risk (defined as 20% or greater lifetime risk).  Ordering a breast MRI at this time would be at the discretion of Chelsey Shepherd's physician. We discussed that Chelsey Shepherd should discuss her individual situation with her  referring physician and determine a breast cancer screening plan with which they are both comfortable.   Genetic Testing for Family Members: Because there is a maternal family history of breast cancer and a paternal family history of ovarian cancer in Ms. Criscuolo's family, we cannot be sure from which parent this mutation was inherited from.  We discussed that family members from both sides of the family would be eligible for genetic testing for this specific mutation identified in her, but that it may be more helpful for these individuals to undergo larger panel testing (considering more genes related to breast/ovarian cancer) because we cannot rule out additional genetic findings-particularly since both sides of the family demonstrate characteristics of hereditary cancer syndromes.  If family members who have been diagnosed with cancer test positive for this same mutation within the NBN gene, this may be helpful for better understanding Ms. Sibal's own cancer risks.  Regarding [place] Breakage Syndrome: If an individual inherits two mutations in the NBN gene (one from each parent) this can lead to a condition called [place] Breakage Syndrome (NBS), which is a very rare autosomal recessive condition involving a variety of developmental abnormalities including microcephaly (small head size), immune deficiency predisposition to leukemia or lymphoma. When an individual and her/his partner both carry an NBN mutation, their chance to have a child affected with NBS is 25%. For family planning purposes, genetic counseling regarding NBS may be considered for individuals of  reproductive age in at-risk families.   Follow-up: As more individuals undergo genetic testing and more information is gathered on carriers of mutations in the NBN gene, screening and/or recommendations may change. We urge Ms. Marken to keep in touch so that new management recommendations may be discussed as they become available. It is  important to note that this genetic risk assessment is based on Ms. Petteway's family and medical history information as provided by her at the time of her visit. With the rapid pace of medical research and improvements in technology, new discoveries may modify this assessment; therefore, we encourage Ms. Belsito to re-contact us periodically for information on advances in the field or if there are any updates, new diagnoses or corrections to Ms. Fenstermacher's family history information.  Chelsey Luz, MS Genetic Counselor [email or web address] Phone: [phone]

## 2014-09-26 ENCOUNTER — Encounter: Payer: Self-pay | Admitting: Gynecology

## 2014-09-26 ENCOUNTER — Telehealth: Payer: Self-pay | Admitting: Gynecology

## 2014-09-26 NOTE — Telephone Encounter (Signed)
I called patient in follow up of her recent genetic counseling and genetic results which show a positive 2184+1G  mutaion in the NBN gene which confers an increased risk of breast cancer. A different mutation in the same in the NBN gene confers a 30% risk of breast cancer. The patient's mutation is a different mutation and they have no specific numbers as far as what her increased risk is. At this point per genetic Counselor statement there is no national guidelines as far as screening these patients. I reviewed with the patient to issues the first is to alert her family members to allow their decision as far as whether to be screened for this gene mutation and the second issue is doing increased surveillance with MRIs annually. I reviewed the issue of cost and whether the insurance wouldn't pay for this and regardless if they do whether she would want to pursue this additional screening. I also reviewed up to and including prophylactic mastectomies and the issue as to whether other cancer risks are increased such as ovarian although at this point there is no information with the NBN gene. The patient plans on annual 3-D mammography and she wants to discuss with her family as far as MRIs. She will call if she wants to pursue this again understanding the insurance company may or may not pay for this. I also encouraged her to keep in touch with the genetic counselor on an annual basis to make sure newer information has not been discovered in reference to this gene.

## 2014-11-17 ENCOUNTER — Encounter (HOSPITAL_COMMUNITY): Payer: Self-pay

## 2014-12-13 ENCOUNTER — Telehealth: Payer: Self-pay | Admitting: Genetic Counselor

## 2014-12-14 NOTE — Telephone Encounter (Signed)
Discussed NCCN guideline updates with Chelsey Shepherd.  Updates were released on October 30, 2014.  These state that the increased risk of breast cancer conferred by NBN pathogenic and likely pathogenic mutations, along with annual mammogram, warrant consideration of breast MRI with contrast starting at 57 years of age.  No other cancer risks/guidelines for NBN have been updated.  Chelsey Shepherd would like me to email her a copy of these new recommendations and I am happy to do that.  She is welcome to call me with any questions.

## 2014-12-20 ENCOUNTER — Telehealth: Payer: Self-pay | Admitting: Gynecology

## 2014-12-20 NOTE — Telephone Encounter (Signed)
Tell patient I received a follow up genetic counseling recommendation that we consider annual MRIs of the breasts due to the genetic mutation and no or recommendations. If patient is interested in MRI of the breast then we can go ahead and order this for her.

## 2014-12-21 NOTE — Telephone Encounter (Signed)
Left message for pt to call.

## 2014-12-29 NOTE — Telephone Encounter (Signed)
Left message for pt to call again.

## 2015-01-17 NOTE — Telephone Encounter (Signed)
Patient informed with the below note, she declined to have MRI at this time and will call if she would like sooner.

## 2015-05-30 DIAGNOSIS — Z803 Family history of malignant neoplasm of breast: Secondary | ICD-10-CM | POA: Diagnosis not present

## 2015-05-30 DIAGNOSIS — Z1231 Encounter for screening mammogram for malignant neoplasm of breast: Secondary | ICD-10-CM | POA: Diagnosis not present

## 2015-07-24 DIAGNOSIS — E7800 Pure hypercholesterolemia, unspecified: Secondary | ICD-10-CM | POA: Diagnosis not present

## 2015-07-24 DIAGNOSIS — J9801 Acute bronchospasm: Secondary | ICD-10-CM | POA: Diagnosis not present

## 2015-07-24 DIAGNOSIS — F411 Generalized anxiety disorder: Secondary | ICD-10-CM | POA: Diagnosis not present

## 2015-08-10 ENCOUNTER — Encounter: Payer: Self-pay | Admitting: Gynecology

## 2015-08-10 ENCOUNTER — Ambulatory Visit (INDEPENDENT_AMBULATORY_CARE_PROVIDER_SITE_OTHER): Payer: BLUE CROSS/BLUE SHIELD | Admitting: Gynecology

## 2015-08-10 VITALS — BP 120/74 | Ht 64.0 in | Wt 134.0 lb

## 2015-08-10 DIAGNOSIS — Z79890 Hormone replacement therapy (postmenopausal): Secondary | ICD-10-CM

## 2015-08-10 DIAGNOSIS — Z8041 Family history of malignant neoplasm of ovary: Secondary | ICD-10-CM | POA: Diagnosis not present

## 2015-08-10 DIAGNOSIS — N952 Postmenopausal atrophic vaginitis: Secondary | ICD-10-CM

## 2015-08-10 DIAGNOSIS — Z01419 Encounter for gynecological examination (general) (routine) without abnormal findings: Secondary | ICD-10-CM | POA: Diagnosis not present

## 2015-08-10 MED ORDER — PROGESTERONE MICRONIZED 100 MG PO CAPS: ORAL_CAPSULE | ORAL | Status: DC

## 2015-08-10 MED ORDER — ESTRADIOL 0.5 MG PO TABS: 0.5000 mg | ORAL_TABLET | Freq: Every day | ORAL | Status: DC

## 2015-08-10 NOTE — Progress Notes (Signed)
Chelsey Shepherd 1955-05-25 [id number]        60 y.o.  G2P0020  for annual exam.  Doing well. Several issues noted below.  Past medical history,surgical history, problem list, medications, allergies, family history and social history were all reviewed and documented as reviewed in the EPIC chart.  ROS:  Performed with pertinent positives and negatives included in the history, assessment and plan.   Additional significant findings :  None   Exam: [name] assistant Filed Vitals:   08/10/15 1124  BP: 120/74  Height: 5\' 4"  (1.626 m)  Weight: 134 lb (60.782 kg)   General appearance:  Normal affect, orientation and appearance. Skin: Grossly normal HEENT: Without gross lesions.  No cervical or supraclavicular adenopathy. Thyroid normal.  Lungs:  Clear without wheezing, rales or rhonchi Cardiac: RR, without RMG Abdominal:  Soft, nontender, without masses, guarding, rebound, organomegaly or hernia Breasts:  Examined lying and sitting without masses, retractions, discharge or axillary adenopathy.  Bilateral implants noted. Pelvic:  Ext/BUS/vagina Normal with mild atrophic changes  Cervix normal  Uterus normal size, shape and contour, midline and mobile nontender   Adnexa without masses or tenderness    Anus and perineum normal   Rectovaginal normal sphincter tone without palpated masses or tenderness.    Assessment/Plan:  60 y.o. G60P0020 female for annual exam.   1. Postmenopausal/HRT. Continues on Estrace 0.5 mg and Prometrium 100 mg. Tried stopping it for a month and had tremendous hot flashes and sweats. Wants to continue. I reviewed [facility] most recent update on HRT to include a shared decision process between patient and physician. Risks of thrombosis and breast cancer versus benefits of symptom relief possible cardiovascular started early all reviewed.  Very strong family history of both breast and ovarian cancer as well as colon cancer. Saw genetic counselor and was checked with  a panel of genetic screening. She was positive for the 2184+1G mutation in the NBN gene which gives her a increased risk of breast cancer lifetime. The exact number cannot be calculated. I reviewed this in conjunction with possible increased risk of breast cancer associated with HRT. Unknown influence as far as whether this will increase her risk above her genetic risk discussed. The patient ultimately wants to continue HRT because she feels life is miserable without this and I refilled her 1 year.   2. Positive genetic screening as above. Genetic counselor recommended continuing annual mammography and consider annual MRI. Patient declined previously. I again discussed the complementary nature between both the mammogram and MRI. Offered to schedule for her now but she declined. So she will call back if she decides she wants to pursue this. Last mammogram 05/2015. We'll continue with annual mammography. SBE monthly reviewed. 3. Family history of ovarian cancer. Reviewed various strategies to include screening with CA-125 and ultrasounds up to including atelectatic salpingo-oophorectomy. Patient wants to proceed with the ultrasound but declines the others. Patient will schedule follow up for the ultrasound. 4. DEXA 2012 normal. Plan repeat at age 70. Increased calcium vitamin D. 5. Colonoscopy 2014. Plan repeat at their recommended schedule. 6. Pap smear/HPV 07/2014 negative. No Pap smear done today. No history of significant abnormal Pap smears previously. 7. Health maintenance. No routine lab work done as this is done at her primary physician's office. Follow up for the ultrasound, call back if she wants to proceed with MRI, follow up in one year for annual exam.   [name] MD, 11:47 AM 08/10/2015

## 2015-08-10 NOTE — Patient Instructions (Signed)
Follow up for the ultrasound as scheduled.  Call if you decide to proceed with the MRI.  You may obtain a copy of any labs that were done today by logging onto MyChart as outlined in the instructions provided with your AVS (after visit summary). The office will not call with normal lab results but certainly if there are any significant abnormalities then we will contact you.   Health Maintenance Adopting a healthy lifestyle and getting preventive care can go a long way to promote health and wellness. Talk with your health care provider about what schedule of regular examinations is right for you. This is a good chance for you to check in with your provider about disease prevention and staying healthy. In between checkups, there are plenty of things you can do on your own. Experts have done a lot of research about which lifestyle changes and preventive measures are most likely to keep you healthy. Ask your health care provider for more information. WEIGHT AND DIET  Eat a healthy diet  Be sure to include plenty of vegetables, fruits, low-fat dairy products, and lean protein.  Do not eat a lot of foods high in solid fats, added sugars, or salt.  Get regular exercise. This is one of the most important things you can do for your health.  Most adults should exercise for at least 150 minutes each week. The exercise should increase your heart rate and make you sweat (moderate-intensity exercise).  Most adults should also do strengthening exercises at least twice a week. This is in addition to the moderate-intensity exercise.  Maintain a healthy weight  Body mass index (BMI) is a measurement that can be used to identify possible weight problems. It estimates body fat based on height and weight. Your health care provider can help determine your BMI and help you achieve or maintain a healthy weight.  For females 80 years of age and older:   A BMI below 18.5 is considered underweight.  A BMI of  18.5 to 24.9 is normal.  A BMI of 25 to 29.9 is considered overweight.  A BMI of 30 and above is considered obese.  Watch levels of cholesterol and blood lipids  You should start having your blood tested for lipids and cholesterol at 59 years of age, then have this test every 5 years.  You may need to have your cholesterol levels checked more often if:  Your lipid or cholesterol levels are high.  You are older than 59 years of age.  You are at high risk for heart disease.  CANCER SCREENING   Lung Cancer  Lung cancer screening is recommended for adults 38-19 years old who are at high risk for lung cancer because of a history of smoking.  A yearly low-dose CT scan of the lungs is recommended for people who:  Currently smoke.  Have quit within the past 15 years.  Have at least a 30-pack-year history of smoking. A pack year is smoking an average of one pack of cigarettes a day for 1 year.  Yearly screening should continue until it has been 15 years since you quit.  Yearly screening should stop if you develop a health problem that would prevent you from having lung cancer treatment.  Breast Cancer  Practice breast self-awareness. This means understanding how your breasts normally appear and feel.  It also means doing regular breast self-exams. Let your health care provider know about any changes, no matter how small.  If you are in your  75s or 38s, you should have a clinical breast exam (CBE) by a health care provider every 1-3 years as part of a regular health exam.  If you are 57 or older, have a CBE every year. Also consider having a breast X-ray (mammogram) every year.  If you have a family history of breast cancer, talk to your health care provider about genetic screening.  If you are at high risk for breast cancer, talk to your health care provider about having an MRI and a mammogram every year.  Breast cancer gene (BRCA) assessment is recommended for women who  have family members with BRCA-related cancers. BRCA-related cancers include:  Breast.  Ovarian.  Tubal.  Peritoneal cancers.  Results of the assessment will determine the need for genetic counseling and BRCA1 and BRCA2 testing. Cervical Cancer Routine pelvic examinations to screen for cervical cancer are no longer recommended for nonpregnant women who are considered low risk for cancer of the pelvic organs (ovaries, uterus, and vagina) and who do not have symptoms. A pelvic examination may be necessary if you have symptoms including those associated with pelvic infections. Ask your health care provider if a screening pelvic exam is right for you.   The Pap test is the screening test for cervical cancer for women who are considered at risk.  If you had a hysterectomy for a problem that was not cancer or a condition that could lead to cancer, then you no longer need Pap tests.  If you are older than 65 years, and you have had normal Pap tests for the past 10 years, you no longer need to have Pap tests.  If you have had past treatment for cervical cancer or a condition that could lead to cancer, you need Pap tests and screening for cancer for at least 20 years after your treatment.  If you no longer get a Pap test, assess your risk factors if they change (such as having a new sexual partner). This can affect whether you should start being screened again.  Some women have medical problems that increase their chance of getting cervical cancer. If this is the case for you, your health care provider may recommend more frequent screening and Pap tests.  The human papillomavirus (HPV) test is another test that may be used for cervical cancer screening. The HPV test looks for the virus that can cause cell changes in the cervix. The cells collected during the Pap test can be tested for HPV.  The HPV test can be used to screen women 94 years of age and older. Getting tested for HPV can extend the  interval between normal Pap tests from three to five years.  An HPV test also should be used to screen women of any age who have unclear Pap test results.  After 59 years of age, women should have HPV testing as often as Pap tests.  Colorectal Cancer  This type of cancer can be detected and often prevented.  Routine colorectal cancer screening usually begins at 59 years of age and continues through 59 years of age.  Your health care provider may recommend screening at an earlier age if you have risk factors for colon cancer.  Your health care provider may also recommend using home test kits to check for hidden blood in the stool.  A small camera at the end of a tube can be used to examine your colon directly (sigmoidoscopy or colonoscopy). This is done to check for the earliest forms of colorectal  cancer.  Routine screening usually begins at age 65.  Direct examination of the colon should be repeated every 5-10 years through 59 years of age. However, you may need to be screened more often if early forms of precancerous polyps or small growths are found. Skin Cancer  Check your skin from head to toe regularly.  Tell your health care provider about any new moles or changes in moles, especially if there is a change in a mole's shape or color.  Also tell your health care provider if you have a mole that is larger than the size of a pencil eraser.  Always use sunscreen. Apply sunscreen liberally and repeatedly throughout the day.  Protect yourself by wearing long sleeves, pants, a wide-brimmed hat, and sunglasses whenever you are outside. HEART DISEASE, DIABETES, AND HIGH BLOOD PRESSURE   Have your blood pressure checked at least every 1-2 years. High blood pressure causes heart disease and increases the risk of stroke.  If you are between 33 years and 20 years old, ask your health care provider if you should take aspirin to prevent strokes.  Have regular diabetes screenings. This  involves taking a blood sample to check your fasting blood sugar level.  If you are at a normal weight and have a low risk for diabetes, have this test once every three years after 59 years of age.  If you are overweight and have a high risk for diabetes, consider being tested at a younger age or more often. PREVENTING INFECTION  Hepatitis B  If you have a higher risk for hepatitis B, you should be screened for this virus. You are considered at high risk for hepatitis B if:  You were born in a country where hepatitis B is common. Ask your health care provider which countries are considered high risk.  Your parents were born in a high-risk country, and you have not been immunized against hepatitis B (hepatitis B vaccine).  You have HIV or AIDS.  You use needles to inject street drugs.  You live with someone who has hepatitis B.  You have had sex with someone who has hepatitis B.  You get hemodialysis treatment.  You take certain medicines for conditions, including cancer, organ transplantation, and autoimmune conditions. Hepatitis C  Blood testing is recommended for:  Everyone born from [birth year] through 1964.  Anyone with known risk factors for hepatitis C. Sexually transmitted infections (STIs)  You should be screened for sexually transmitted infections (STIs) including gonorrhea and chlamydia if:  You are sexually active and are younger than 59 years of age.  You are older than 59 years of age and your health care provider tells you that you are at risk for this type of infection.  Your sexual activity has changed since you were last screened and you are at an increased risk for chlamydia or gonorrhea. Ask your health care provider if you are at risk.  If you do not have HIV, but are at risk, it may be recommended that you take a prescription medicine daily to prevent HIV infection. This is called pre-exposure prophylaxis (PrEP). You are considered at risk if:  You are  sexually active and do not regularly use condoms or know the HIV status of your partner(s).  You take drugs by injection.  You are sexually active with a partner who has HIV. Talk with your health care provider about whether you are at high risk of being infected with HIV. If you choose to begin PrEP, you  should first be tested for HIV. You should then be tested every 3 months for as long as you are taking PrEP.  PREGNANCY   If you are premenopausal and you may become pregnant, ask your health care provider about preconception counseling.  If you may become pregnant, take 400 to 800 micrograms (mcg) of folic acid every day.  If you want to prevent pregnancy, talk to your health care provider about birth control (contraception). OSTEOPOROSIS AND MENOPAUSE   Osteoporosis is a disease in which the bones lose minerals and strength with aging. This can result in serious bone fractures. Your risk for osteoporosis can be identified using a bone density scan.  If you are 51 years of age or older, or if you are at risk for osteoporosis and fractures, ask your health care provider if you should be screened.  Ask your health care provider whether you should take a calcium or vitamin D supplement to lower your risk for osteoporosis.  Menopause may have certain physical symptoms and risks.  Hormone replacement therapy may reduce some of these symptoms and risks. Talk to your health care provider about whether hormone replacement therapy is right for you.  HOME CARE INSTRUCTIONS   Schedule regular health, dental, and eye exams.  Stay current with your immunizations.   Do not use any tobacco products including cigarettes, chewing tobacco, or electronic cigarettes.  If you are pregnant, do not drink alcohol.  If you are breastfeeding, limit how much and how often you drink alcohol.  Limit alcohol intake to no more than 1 drink per day for nonpregnant women. One drink equals 12 ounces of beer, 5  ounces of wine, or 1 ounces of hard liquor.  Do not use street drugs.  Do not share needles.  Ask your health care provider for help if you need support or information about quitting drugs.  Tell your health care provider if you often feel depressed.  Tell your health care provider if you have ever been abused or do not feel safe at home. Document Released: 08/12/2010 Document Revised: 06/13/2013 Document Reviewed: 12/29/2012 [facility] Patient Information 2015 [place], [place]. This information is not intended to replace advice given to you by your health care provider. Make sure you discuss any questions you have with your health care provider.

## 2015-09-03 ENCOUNTER — Other Ambulatory Visit: Payer: Self-pay | Admitting: Gynecology

## 2015-09-03 ENCOUNTER — Ambulatory Visit (INDEPENDENT_AMBULATORY_CARE_PROVIDER_SITE_OTHER): Payer: BLUE CROSS/BLUE SHIELD | Admitting: Gynecology

## 2015-09-03 ENCOUNTER — Ambulatory Visit (INDEPENDENT_AMBULATORY_CARE_PROVIDER_SITE_OTHER): Payer: BLUE CROSS/BLUE SHIELD

## 2015-09-03 ENCOUNTER — Encounter: Payer: Self-pay | Admitting: Gynecology

## 2015-09-03 VITALS — BP 122/74

## 2015-09-03 DIAGNOSIS — Z803 Family history of malignant neoplasm of breast: Secondary | ICD-10-CM

## 2015-09-03 DIAGNOSIS — Z1501 Genetic susceptibility to malignant neoplasm of breast: Secondary | ICD-10-CM | POA: Diagnosis not present

## 2015-09-03 DIAGNOSIS — N83202 Unspecified ovarian cyst, left side: Secondary | ICD-10-CM | POA: Diagnosis not present

## 2015-09-03 DIAGNOSIS — D251 Intramural leiomyoma of uterus: Secondary | ICD-10-CM | POA: Diagnosis not present

## 2015-09-03 DIAGNOSIS — Z8041 Family history of malignant neoplasm of ovary: Secondary | ICD-10-CM

## 2015-09-03 NOTE — Progress Notes (Signed)
    Chelsey Shepherd 03/24/1955 [id number]        60 y.o.  G2P0020 presents for follow up ultrasound. History of a positive genetic marker with increased risk of breast cancer although not well calculated number. Also with family history of ovarian cancer in paternal grandmother and paternal aunt. Reports negative BRCA testing in the aunt. Positive breast cancer history in maternal aunt 3 and maternal cousin 2.  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  We discussed screening modalities to include annual mammography with annual MRI. At this point the patient has declined MRI. Also from an ovarian surveillance standpoint serum screening such as CA 125 and ultrasound screening versus proceeding with prophylactic salpingo-oophorectomy also reviewed. Lastly we have discussed the issues of HRT which may confer a higher risk of breast cancer regardless of the positive genetic marker and that this may compound and lead to a higher risk of breast cancer all of which she understands, accepts and wants to continue HRT from a quality of life standpoint.  Past medical history,surgical history, problem list, medications, allergies, family history and social history were all reviewed and documented in the EPIC chart.  Directed ROS with pertinent positives and negatives documented in the history of present illness/assessment and plan.  Exam: Vitals:   09/03/15 1149  BP: 122/74   General appearance:  Normal  Ultrasound shows uterus normal size with small intramural myoma 9 mm. In the medial echo 4.2 mm. Right ovary normal. Left ovary normal with small echo-free 7 x 5 mm cyst   unchanged from prior study. Cul-de-sac negative.   Assessment/Plan:  60 y.o. G2P0020  with above history. I again reviewed all of the issues to include MRI annually in addition to her mammography, continuing with ovarian surveillance up to and including salpingo-oophorectomy. At this point the patient declines scheduling an MRI or  proceeding with anything further from the ovarian standpoint. She will follow up with me at her next annual exam and will rediscuss. She'll call me sooner if she changes her mind. She plans to continue HRT. She has done no bleeding and she knows importance of calling if she does any vaginal bleeding.  Greater than 50% of my time was spent in direct face to face counseling and coordination of care with the patient.  [name] MD, 12:11 PM 09/03/2015

## 2015-09-03 NOTE — Patient Instructions (Signed)
Call if you change your mind about scheduling the breast MRI or have any other questions or issues. Otherwise follow up when you are due for your next annual exam.   Call if any vaginal bleeding

## 2015-09-18 DIAGNOSIS — G5711 Meralgia paresthetica, right lower limb: Secondary | ICD-10-CM | POA: Diagnosis not present

## 2015-09-18 DIAGNOSIS — Z23 Encounter for immunization: Secondary | ICD-10-CM | POA: Diagnosis not present

## 2015-09-18 DIAGNOSIS — F419 Anxiety disorder, unspecified: Secondary | ICD-10-CM | POA: Diagnosis not present

## 2015-11-12 ENCOUNTER — Other Ambulatory Visit: Payer: Self-pay | Admitting: *Deleted

## 2015-11-12 MED ORDER — ESTRADIOL 0.5 MG PO TABS: 0.5000 mg | ORAL_TABLET | Freq: Every day | ORAL | 8 refills | Status: DC

## 2015-12-23 ENCOUNTER — Other Ambulatory Visit: Payer: Self-pay | Admitting: Gynecology

## 2016-04-11 ENCOUNTER — Telehealth: Payer: Self-pay

## 2016-04-11 NOTE — Telephone Encounter (Signed)
Patient called because her Prometrium Rx has increased in price for 90days from $12 to $75. She said she cannot afford this with all her other meds. And wondered if more cost efficient progesterone that she could try?  She was advised Dr. Loetta Rough off today and I will call her Monday.

## 2016-04-14 MED ORDER — MEDROXYPROGESTERONE ACETATE 2.5 MG PO TABS: 2.5000 mg | ORAL_TABLET | Freq: Every day | ORAL | 0 refills | Status: DC

## 2016-04-14 NOTE — Telephone Encounter (Signed)
Okay for medroxyprogesterone 2.5 mg daily #30 with refills through next annual exam

## 2016-04-14 NOTE — Telephone Encounter (Signed)
Left message in voice mail for patient. Rx sent.

## 2016-05-12 ENCOUNTER — Ambulatory Visit
Admission: RE | Admit: 2016-05-12 | Discharge: 2016-05-12 | Disposition: A | Discharge status: Home / Self Care | Payer: BLUE CROSS/BLUE SHIELD | Source: Ambulatory Visit | Attending: Family Medicine | Admitting: Family Medicine

## 2016-05-12 ENCOUNTER — Other Ambulatory Visit: Payer: Self-pay | Admitting: Family Medicine

## 2016-05-12 DIAGNOSIS — R0602 Shortness of breath: Secondary | ICD-10-CM | POA: Diagnosis not present

## 2016-05-12 DIAGNOSIS — R05 Cough: Secondary | ICD-10-CM

## 2016-05-12 DIAGNOSIS — R059 Cough, unspecified: Secondary | ICD-10-CM

## 2016-05-30 ENCOUNTER — Encounter: Payer: Self-pay | Admitting: Gynecology

## 2016-05-30 DIAGNOSIS — Z1231 Encounter for screening mammogram for malignant neoplasm of breast: Secondary | ICD-10-CM | POA: Diagnosis not present

## 2016-08-20 ENCOUNTER — Ambulatory Visit (INDEPENDENT_AMBULATORY_CARE_PROVIDER_SITE_OTHER): Payer: BLUE CROSS/BLUE SHIELD | Admitting: Gynecology

## 2016-08-20 ENCOUNTER — Encounter: Payer: Self-pay | Admitting: Gynecology

## 2016-08-20 VITALS — BP 120/76 | Ht 64.0 in | Wt 138.0 lb

## 2016-08-20 DIAGNOSIS — N952 Postmenopausal atrophic vaginitis: Secondary | ICD-10-CM

## 2016-08-20 DIAGNOSIS — Z13220 Encounter for screening for lipoid disorders: Secondary | ICD-10-CM

## 2016-08-20 DIAGNOSIS — Z01411 Encounter for gynecological examination (general) (routine) with abnormal findings: Secondary | ICD-10-CM | POA: Diagnosis not present

## 2016-08-20 DIAGNOSIS — N83209 Unspecified ovarian cyst, unspecified side: Secondary | ICD-10-CM | POA: Diagnosis not present

## 2016-08-20 LAB — CBC WITH DIFFERENTIAL/PLATELET
Basophils Absolute: 0 cells/uL (ref 0–200)
Basophils Relative: 0 %
EOS PCT: 4 %
Eosinophils Absolute: 244 cells/uL (ref 15–500)
HCT: 41.7 % (ref 35.0–45.0)
Hemoglobin: 13.9 g/dL (ref 11.7–15.5)
LYMPHS ABS: 1403 {cells}/uL (ref 850–3900)
LYMPHS PCT: 23 %
MCH: 31.2 pg (ref 27.0–33.0)
MCHC: 33.3 g/dL (ref 32.0–36.0)
MCV: 93.7 fL (ref 80.0–100.0)
MONOS PCT: 4 %
MPV: 9.9 fL (ref 7.5–12.5)
Monocytes Absolute: 244 cells/uL (ref 200–950)
NEUTROS PCT: 69 %
Neutro Abs: 4209 cells/uL (ref 1500–7800)
PLATELETS: 237 10*3/uL (ref 140–400)
RBC: 4.45 MIL/uL (ref 3.80–5.10)
RDW: 13.4 % (ref 11.0–15.0)
WBC: 6.1 10*3/uL (ref 3.8–10.8)

## 2016-08-20 LAB — LIPID PANEL
Cholesterol: 184 mg/dL (ref <200)
HDL: 92 mg/dL (ref >50)
LDL CALC: 76 mg/dL (ref <100)
Total CHOL/HDL Ratio: 2 Ratio (ref <5.0)
Triglycerides: 80 mg/dL (ref <150)
VLDL: 16 mg/dL (ref <30)

## 2016-08-20 LAB — COMPREHENSIVE METABOLIC PANEL
ALT: 20 U/L (ref 6–29)
AST: 23 U/L (ref 10–35)
Albumin: 4.3 g/dL (ref 3.6–5.1)
Alkaline Phosphatase: 43 U/L (ref 33–130)
BILIRUBIN TOTAL: 0.8 mg/dL (ref 0.2–1.2)
BUN: 13 mg/dL (ref 7–25)
CHLORIDE: 104 mmol/L (ref 98–110)
CO2: 23 mmol/L (ref 20–31)
CREATININE: 0.89 mg/dL (ref 0.50–1.05)
Calcium: 9.1 mg/dL (ref 8.6–10.4)
GLUCOSE: 79 mg/dL (ref 65–99)
Potassium: 4.1 mmol/L (ref 3.5–5.3)
SODIUM: 140 mmol/L (ref 135–146)
Total Protein: 7 g/dL (ref 6.1–8.1)

## 2016-08-20 LAB — TSH: TSH: 3.99 m[IU]/L

## 2016-08-20 NOTE — Patient Instructions (Signed)
Follow up for ultrasound as scheduled 

## 2016-08-20 NOTE — Progress Notes (Signed)
Chelsey Shepherd 1955-04-17 [id number]        61 y.o.  G2P0020 for annual exam.  Several issues as noted below.  Past medical history,surgical history, problem list, medications, allergies, family history and social history were all reviewed and documented as reviewed in the EPIC chart.  ROS:  Performed with pertinent positives and negatives included in the history, assessment and plan.   Additional significant findings :  None   Exam: [name] assistant Vitals:   08/20/16 0855  BP: 120/76  Weight: 138 lb (62.6 kg)  Height: 5\' 4"  (1.626 m)   Body mass index is 23.69 kg/m.  General appearance:  Normal affect, orientation and appearance. Skin: Grossly normal HEENT: Without gross lesions.  No cervical or supraclavicular adenopathy. Thyroid normal.  Lungs:  Clear without wheezing, rales or rhonchi Cardiac: RR, without RMG Abdominal:  Soft, nontender, without masses, guarding, rebound, organomegaly or hernia Breasts:  Examined lying and sitting without masses, retractions, discharge or axillary adenopathy.  Bilateral implants noted. Pelvic:  Ext, BUS, Vagina: Normal with atrophic changes  Cervix: Normal with atrophic changes  Uterus: Anteverted, normal size, shape and contour, midline and mobile nontender   Adnexa: Without masses or tenderness    Anus and perineum: Normal   Rectovaginal: Normal sphincter tone without palpated masses or tenderness.    Assessment/Plan:  61 y.o. G2P0020 female for annual exam.   1. Postmenopausal. Had been on HRT previously but discontinued this in April when she started having some bilateral breast tenderness. She's done well since discontinuing without significant hot flushes or sweats. The breast tenderness seems to have improved. She does note some bilateral tail of Spence discomfort that comes and goes. No palpable abnormalities on self breast exam. Recently had mammogram 05/2016 which was normal. We'll follow expectantly at this point off  of HRT. As long she continues well she'll monitor. If she starts developing significant menopausal symptoms then she'll call. If she has any vaginal bleeding she knows to call. 2. Positive genetic screening for 2184+1G in the NBN gene. It apparently confers an increased risk for breast cancer but the exact number cannot be calculated. She does have a strong family history of breast, ovarian and colon cancer. We discussed the options for MRI of the breasts for additional screening and she declines. At this point she wants to continue with annual 3-D mammogram but will call if she changes her mind as far as the MRI.  I reviewed the history of ovarian cancer in the family. Possible increased risk for the patient. Options to include observation, screening with CA-125 and ultrasounds up to including prophylactic salpingo-oophorectomies. The pros/cons and risks/benefits of these choice discussed to include false positive and false negatives. At this point the patient wants to proceed with ultrasound but does not want serum screening or surgery. She'll schedule the ultrasound in follow up for this.  She does have a 7 x 5 mm cyst on the left ovary that has been stable over serial studies and will follow up and relook at this area also. 3. Mammography 05/2016. Continue with annual mammography when due. Offered and declined MRI as noted above. Self breast exams monthly reviewed. Breast exam normal today. 4. Pap smear/HPV 07/2014 negative. No history of abnormal Pap smears. No Pap smear done today. Plan repeat Pap smear at 5 year interval per current screening guidelines. 5. DEXA 2012 normal. Plan repeat DEXA at age 31. 58. Colonoscopy 2014. Repeat at their recommended interval. 7. Health maintenance. Patient  requests screening lab work. CBC, CMP, lipid profile, TSH, hepatitis C per CDC recommendations. Follow up in one year, sooner as needed.   [name] MD, 9:35 AM 08/20/2016

## 2016-08-21 LAB — HEPATITIS C ANTIBODY: HCV Ab: NEGATIVE

## 2016-09-17 ENCOUNTER — Other Ambulatory Visit: Payer: BLUE CROSS/BLUE SHIELD

## 2016-09-17 ENCOUNTER — Ambulatory Visit: Payer: BLUE CROSS/BLUE SHIELD | Admitting: Gynecology

## 2017-02-25 DIAGNOSIS — J9801 Acute bronchospasm: Secondary | ICD-10-CM | POA: Diagnosis not present

## 2017-02-25 DIAGNOSIS — R079 Chest pain, unspecified: Secondary | ICD-10-CM | POA: Diagnosis not present

## 2017-02-25 DIAGNOSIS — E7800 Pure hypercholesterolemia, unspecified: Secondary | ICD-10-CM | POA: Diagnosis not present

## 2017-02-25 DIAGNOSIS — R635 Abnormal weight gain: Secondary | ICD-10-CM | POA: Diagnosis not present

## 2017-06-01 DIAGNOSIS — Z1231 Encounter for screening mammogram for malignant neoplasm of breast: Secondary | ICD-10-CM | POA: Diagnosis not present

## 2017-08-09 IMAGING — CR DG CHEST 2V
2 series · 2 of 2 positions shown · non-contrast
Comparison: 09/14/2014.

CLINICAL DATA: Worsening cough.  Shortness of breath.

EXAM:
CHEST  2 VIEW

[w chest pa]
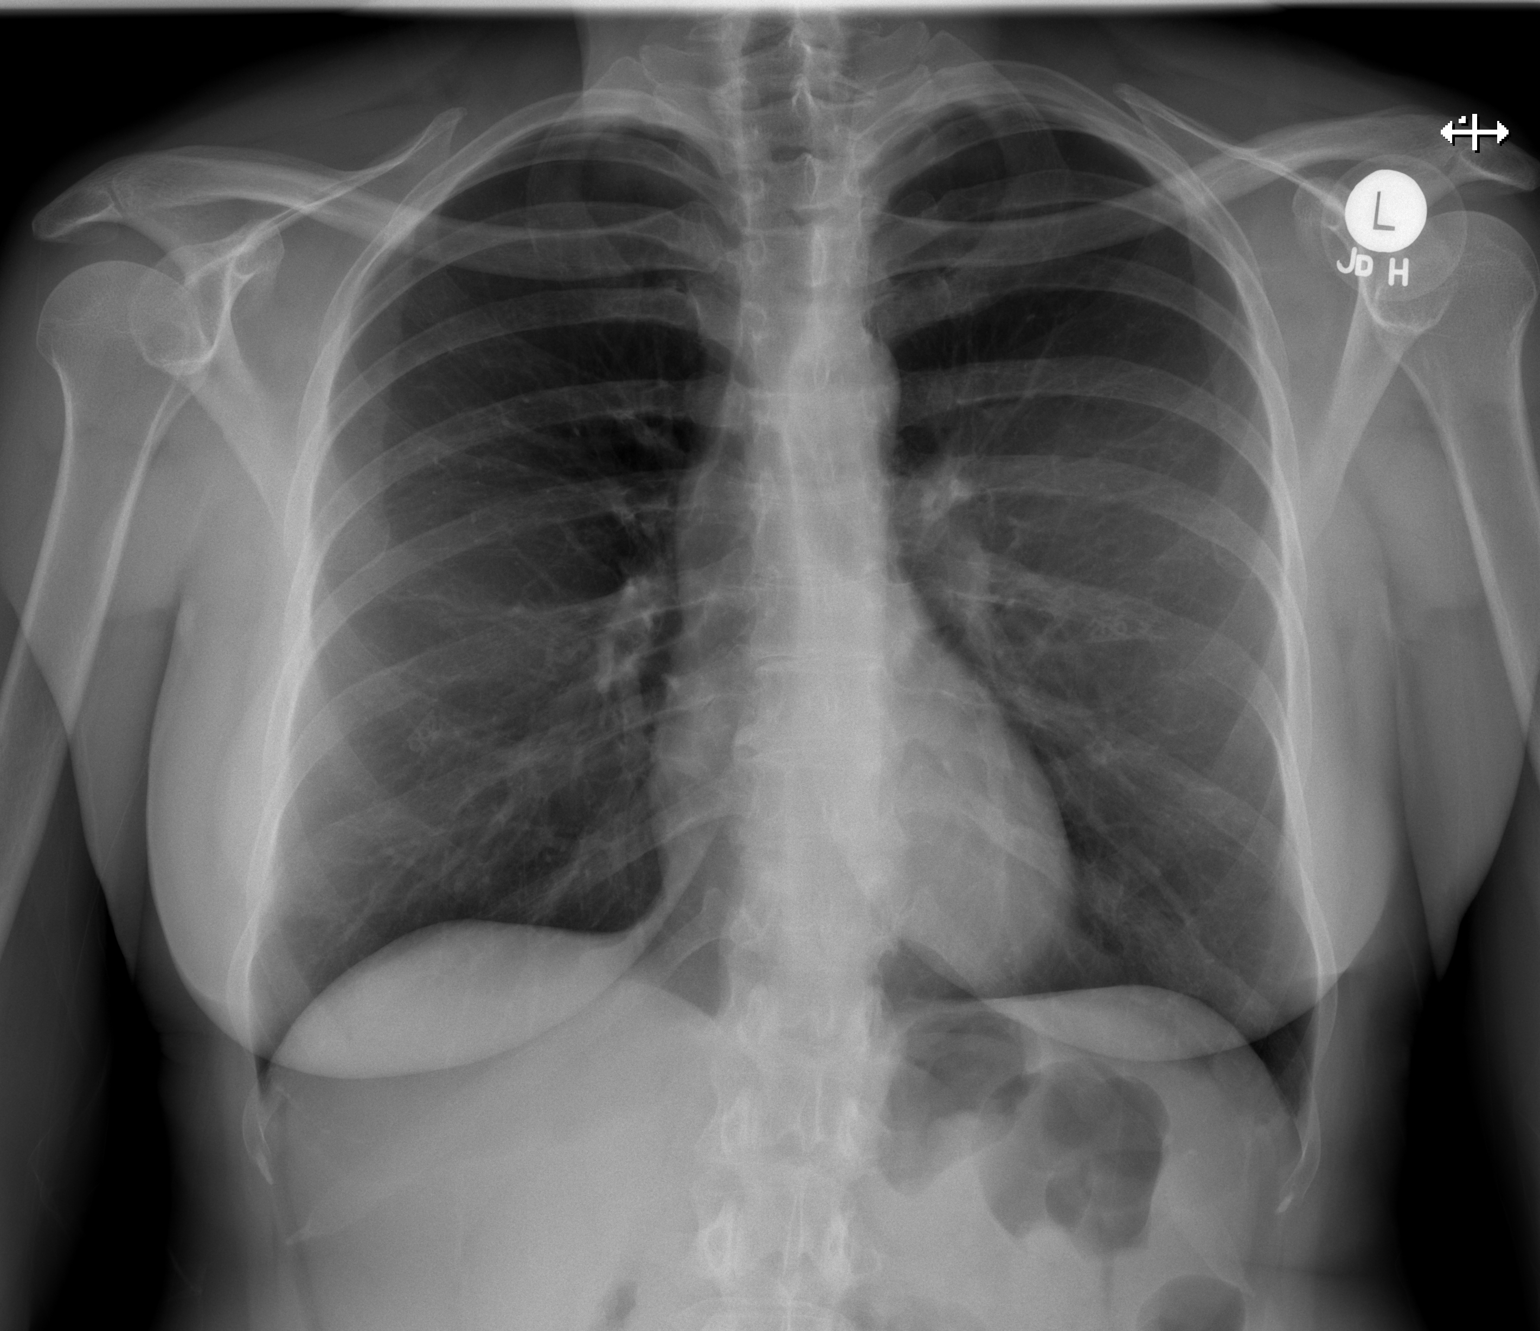

[w chest lat]
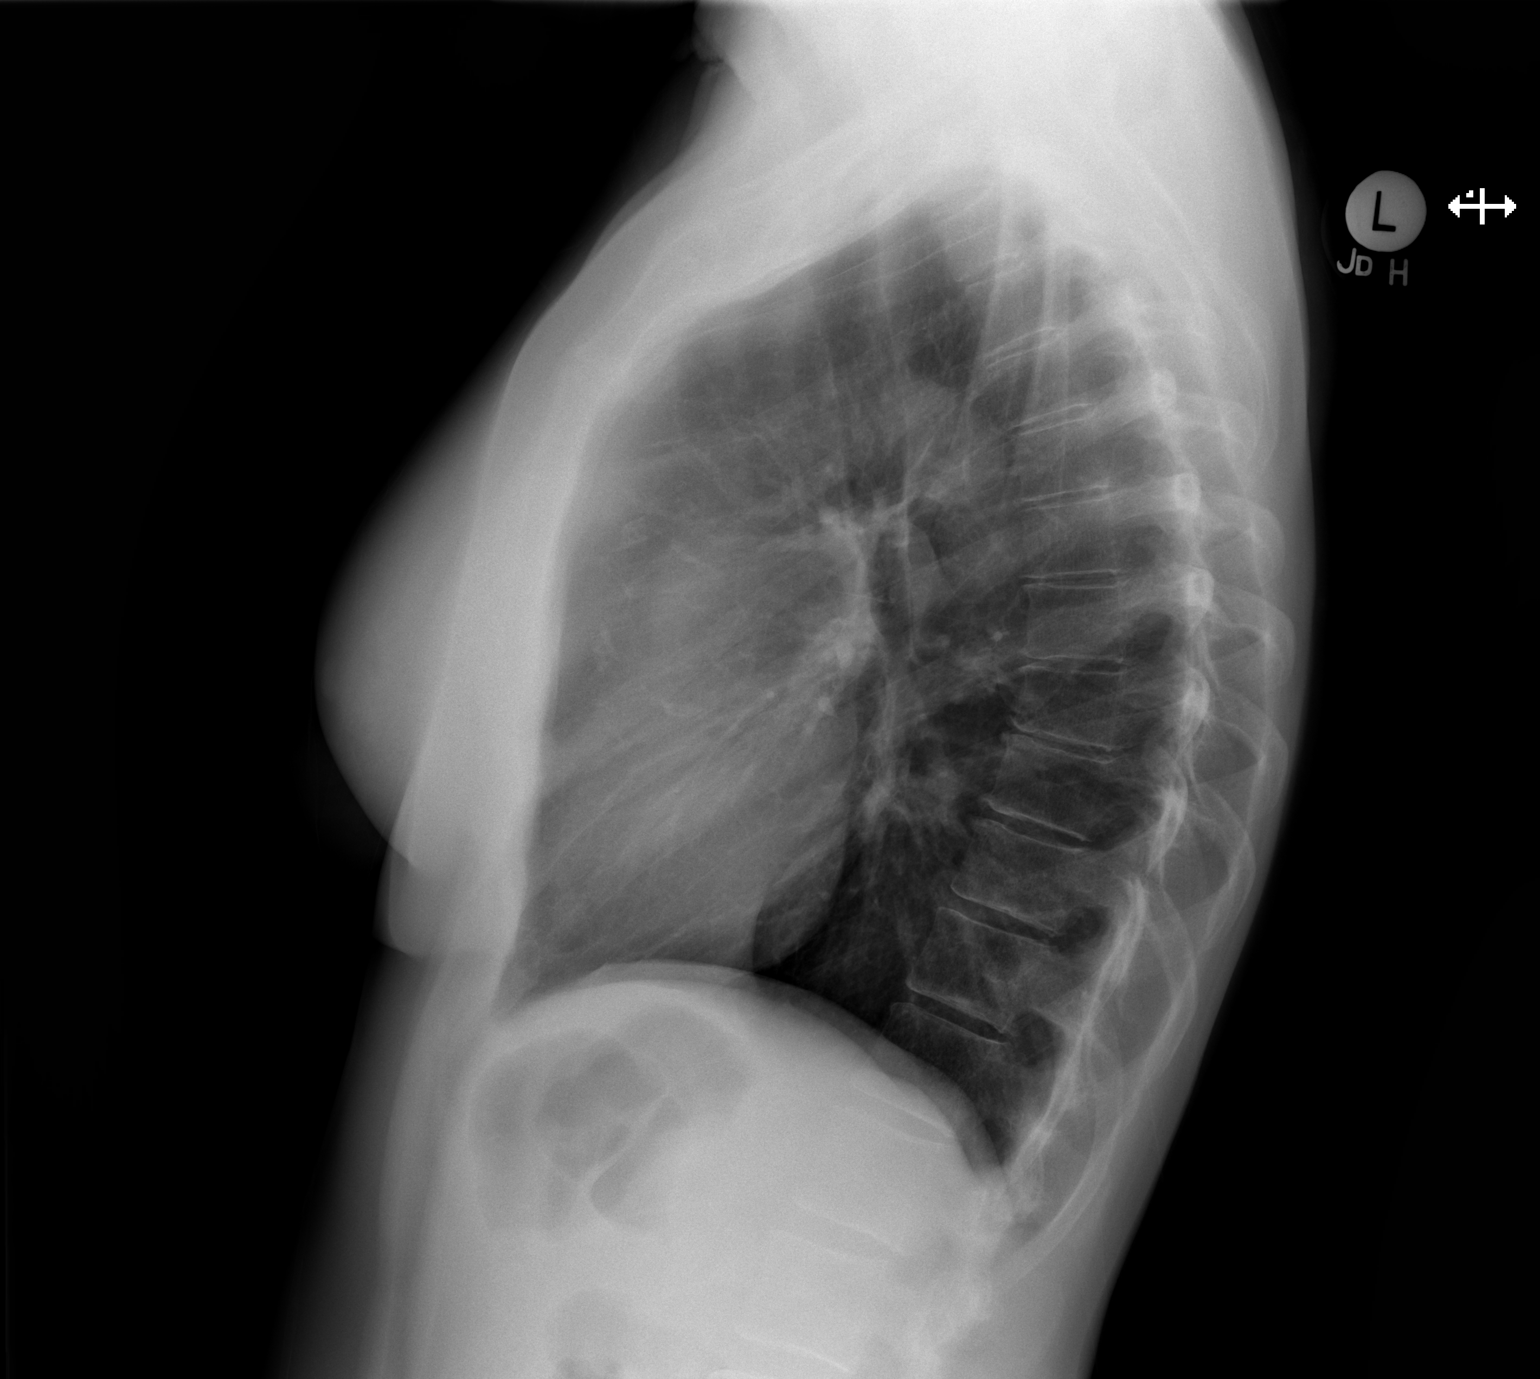

[2 of 2 positions shown; findings below may reference images not displayed]

FINDINGS: Mediastinum and hilar structures are normal. Heart size normal Mild
stable increased density over the left chest most likely overlying
soft tissues. No focal alveolar infiltrate. No pleural effusion or
pneumothorax.
IMPRESSION: No acute cardiopulmonary disease.

## 2017-08-21 ENCOUNTER — Ambulatory Visit: Payer: BLUE CROSS/BLUE SHIELD | Admitting: Gynecology

## 2017-08-21 ENCOUNTER — Encounter: Payer: Self-pay | Admitting: Gynecology

## 2017-08-21 VITALS — BP 118/74 | Ht 64.0 in | Wt 127.0 lb

## 2017-08-21 DIAGNOSIS — N952 Postmenopausal atrophic vaginitis: Secondary | ICD-10-CM

## 2017-08-21 DIAGNOSIS — N83209 Unspecified ovarian cyst, unspecified side: Secondary | ICD-10-CM | POA: Diagnosis not present

## 2017-08-21 DIAGNOSIS — Z13220 Encounter for screening for lipoid disorders: Secondary | ICD-10-CM | POA: Diagnosis not present

## 2017-08-21 DIAGNOSIS — Z01419 Encounter for gynecological examination (general) (routine) without abnormal findings: Secondary | ICD-10-CM | POA: Diagnosis not present

## 2017-08-21 NOTE — Patient Instructions (Signed)
Follow-up for bone density as scheduled.  Follow-up for breast MRI as scheduled.  Call if you change your mind about scheduling an ultrasound.

## 2017-08-21 NOTE — Progress Notes (Signed)
    Chelsey Shepherd 1955/12/23 [id number]        61 y.o.  G2P0020 for annual gynecologic exam.  Several issues noted below.  Past medical history,surgical history, problem list, medications, allergies, family history and social history were all reviewed and documented as reviewed in the EPIC chart.  ROS:  Performed with pertinent positives and negatives included in the history, assessment and plan.   Additional significant findings : None   Exam: [name] assistant Vitals:   08/21/17 0939  BP: 118/74  Weight: 127 lb (57.6 kg)  Height: 5\' 4"  (1.626 m)   Body mass index is 21.8 kg/m.  General appearance:  Normal affect, orientation and appearance. Skin: Grossly normal HEENT: Without gross lesions.  No cervical or supraclavicular adenopathy. Thyroid normal.  Lungs:  Clear without wheezing, rales or rhonchi Cardiac: RR, without RMG Abdominal:  Soft, nontender, without masses, guarding, rebound, organomegaly or hernia Breasts:  Examined lying and sitting without masses, retractions, discharge or axillary adenopathy.  Bilateral implants noted Pelvic:  Ext, BUS, Vagina: With atrophic changes  Cervix: With atrophic changes  Uterus: Anteverted, normal size, shape and contour, midline and mobile nontender   Adnexa: Without masses or tenderness    Anus and perineum: Normal   Rectovaginal: Normal sphincter tone without palpated masses or tenderness.    Assessment/Plan:  62 y.o. G71P0020 female for annual gynecologic exam.   1. Postmenopausal/atrophic genital changes.  Without significant menopausal symptoms or any vaginal bleeding.  Continue to monitor and report any issues or bleeding. 2. 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  We had an extensive discussion last year about her options from a breast screening standpoint as well as from an ovarian cancer issue noting she does have a family history of  ovarian cancer and that she may have a possible increased risk attributable to this gene.  She declined MRI screening of the breast previously but has done annual mammography with last mammogram 05/2017.  This year she does agree for MRI screening and will go ahead and help her schedule this.  The plan would be annual MRI and annual mammography screening.  We rediscussed the ovarian cancer issue.  She does have a history of a small left ovarian cyst 7 x 5 mm that is been stable on serial exams.  She was to do an ultrasound last year but canceled it due to expense.  Options reviewed to include observation, CA 125 screening along with ultrasound recognizing the false positive and false negative issues up to and including laparoscopic BSO.  At this point the patient wants CA 125 but declined ultrasound clearly understanding possible increased risk of ovarian cancer. 3. Pap smear/HPV 07/2014.  No Pap smear done today.  No history of abnormal Pap smears.  Plan repeat Pap smear/HPV at 5-year interval per current screening guidelines 4. Colonoscopy 2014.  Repeat at their recommended interval.  She asked about Cologuard and I asked her to discuss this with her primary physician. 5. DEXA 2012 normal.  Recommend DEXA now at age 54 and patient will schedule in follow-up for this. 6. Health maintenance.  Baseline CBC, CMP, lipid profile, CA 125 ordered.  TSH normal last year.  Hepatitis C screening negative last year.   [name] MD, 10:04 AM 08/21/2017

## 2017-08-24 ENCOUNTER — Encounter: Payer: Self-pay | Admitting: Gynecology

## 2017-08-24 ENCOUNTER — Telehealth: Payer: Self-pay | Admitting: *Deleted

## 2017-08-24 DIAGNOSIS — Z803 Family history of malignant neoplasm of breast: Secondary | ICD-10-CM

## 2017-08-24 LAB — CBC WITH DIFFERENTIAL/PLATELET
BASOS ABS: 39 {cells}/uL (ref 0–200)
BASOS PCT: 0.5 %
EOS PCT: 2.9 %
Eosinophils Absolute: 226 cells/uL (ref 15–500)
HCT: 44.2 % (ref 35.0–45.0)
HEMOGLOBIN: 14.8 g/dL (ref 11.7–15.5)
LYMPHS ABS: 905 {cells}/uL (ref 850–3900)
MCH: 30.5 pg (ref 27.0–33.0)
MCHC: 33.5 g/dL (ref 32.0–36.0)
MCV: 91.1 fL (ref 80.0–100.0)
MPV: 10.8 fL (ref 7.5–12.5)
Monocytes Relative: 4.9 %
NEUTROS ABS: 6248 {cells}/uL (ref 1500–7800)
Neutrophils Relative %: 80.1 %
Platelets: 230 10*3/uL (ref 140–400)
RBC: 4.85 10*6/uL (ref 3.80–5.10)
RDW: 12.8 % (ref 11.0–15.0)
Total Lymphocyte: 11.6 %
WBC mixed population: 382 cells/uL (ref 200–950)
WBC: 7.8 10*3/uL (ref 3.8–10.8)

## 2017-08-24 LAB — COMPREHENSIVE METABOLIC PANEL
AG RATIO: 1.8 (calc) (ref 1.0–2.5)
ALBUMIN MSPROF: 4.5 g/dL (ref 3.6–5.1)
ALT: 26 U/L (ref 6–29)
AST: 25 U/L (ref 10–35)
Alkaline phosphatase (APISO): 52 U/L (ref 33–130)
BUN: 13 mg/dL (ref 7–25)
CALCIUM: 9.7 mg/dL (ref 8.6–10.4)
CO2: 25 mmol/L (ref 20–32)
Chloride: 102 mmol/L (ref 98–110)
Creat: 0.91 mg/dL (ref 0.50–0.99)
GLOBULIN: 2.5 g/dL (ref 1.9–3.7)
Glucose, Bld: 92 mg/dL (ref 65–99)
POTASSIUM: 4 mmol/L (ref 3.5–5.3)
SODIUM: 141 mmol/L (ref 135–146)
TOTAL PROTEIN: 7 g/dL (ref 6.1–8.1)
Total Bilirubin: 0.9 mg/dL (ref 0.2–1.2)

## 2017-08-24 LAB — CA 125: CA 125: 8 U/mL (ref <35)

## 2017-08-24 LAB — LIPID PANEL
Cholesterol: 170 mg/dL (ref <200)
HDL: 67 mg/dL (ref >50)
LDL Cholesterol (Calc): 86 mg/dL (calc)
NON-HDL CHOLESTEROL (CALC): 103 mg/dL (ref <130)
TRIGLYCERIDES: 80 mg/dL (ref <150)
Total CHOL/HDL Ratio: 2.5 (calc) (ref <5.0)

## 2017-08-24 NOTE — Telephone Encounter (Signed)
patient 09/03/17 @ 9:00am at [place] .

## 2017-08-24 NOTE — Telephone Encounter (Signed)
Patient called requesting order for MRI breast per note on 08/21/17 "This year she does agree for MRI screening and will go ahead and help her schedule this. Order placed at [facility], number given to patient to scheduled  [phone]

## 2017-08-25 ENCOUNTER — Telehealth: Payer: Self-pay

## 2017-08-25 ENCOUNTER — Encounter: Payer: Self-pay | Admitting: Gynecology

## 2017-08-25 NOTE — Telephone Encounter (Signed)
Spoke with Melissa at [facility] and received PA order # for Bilat Breast MRI with and w/o contrast ([id number]). Authorization  #[id number] and is valid from today through 09/23/17.

## 2017-08-25 NOTE — Telephone Encounter (Signed)
When I received faxed authorization from [facility] it listed the service as "MRI of One Breast".  I told the rep that I prior authorized it with that it was a Bilateral MRI.  I called and spoke with Alveria Apley and provided CPT [phone] for her to check on and told her the situation. She assured me all is fine. She said that there are multiple codes that fall under my Auth (Order)# and [phone] is one of them so it is also under the same number. Nothing else to do. I am to use Tyesha 08/25/17 1:08pm as call reference.

## 2017-08-25 NOTE — Telephone Encounter (Signed)
Patient called back stating her insurance company prefers she have imaging done at [facility] order faxed to [phone], they will call patient to schedule. I asked patient to call me once schedule so I can document this.

## 2017-08-28 MED ORDER — ALPRAZOLAM 0.5 MG PO TABS: 0.5000 mg | ORAL_TABLET | Freq: Four times a day (QID) | ORAL | 0 refills | Status: AC | PRN

## 2017-08-28 NOTE — Telephone Encounter (Addendum)
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  She is claustrophobia would like to know if she can have something prescribed to take to help keep her calm? Please advise

## 2017-08-28 NOTE — Addendum Note (Signed)
Addended by: Thamas Jaegers on: 08/28/2017 10:51 AM   Modules accepted: Orders

## 2017-08-28 NOTE — Telephone Encounter (Signed)
patient informed, Rx called in.

## 2017-08-28 NOTE — Telephone Encounter (Signed)
Xanax 0.5 mg p.o. every 6 hour for anxiety #5 no refill

## 2017-09-01 ENCOUNTER — Telehealth: Payer: Self-pay

## 2017-09-01 ENCOUNTER — Other Ambulatory Visit: Payer: Self-pay | Admitting: Gynecology

## 2017-09-01 ENCOUNTER — Ambulatory Visit (INDEPENDENT_AMBULATORY_CARE_PROVIDER_SITE_OTHER): Payer: BLUE CROSS/BLUE SHIELD

## 2017-09-01 ENCOUNTER — Encounter: Payer: Self-pay | Admitting: Gynecology

## 2017-09-01 DIAGNOSIS — Z01419 Encounter for gynecological examination (general) (routine) without abnormal findings: Secondary | ICD-10-CM

## 2017-09-01 DIAGNOSIS — Z13820 Encounter for screening for osteoporosis: Secondary | ICD-10-CM | POA: Diagnosis not present

## 2017-09-01 NOTE — Telephone Encounter (Signed)
Patient changed the POS for her MRI of breast to [facility] in [place] because it is more cost efficient for her. I called [employer] and spoke with rep Elie Confer?) and added the new POS and new date of 09/13/17 and she update the order.

## 2017-09-03 ENCOUNTER — Other Ambulatory Visit: Payer: BLUE CROSS/BLUE SHIELD

## 2017-09-13 ENCOUNTER — Encounter: Payer: Self-pay | Admitting: Gynecology

## 2017-09-13 DIAGNOSIS — Z803 Family history of malignant neoplasm of breast: Secondary | ICD-10-CM | POA: Diagnosis not present

## 2017-09-29 ENCOUNTER — Telehealth: Payer: Self-pay | Admitting: *Deleted

## 2017-09-29 NOTE — Telephone Encounter (Signed)
Patient called requesting MRI breast results from [facility] on 09/09/17, I didn't see results scanned in epic. I checked medical records and no results. I called and had to leave a message with [facility] [phone] to fax results to my attention. I relayed this information to patient, once results received I will place on Dr. Phineas Real desk to review.

## 2017-09-30 DIAGNOSIS — L821 Other seborrheic keratosis: Secondary | ICD-10-CM | POA: Diagnosis not present

## 2017-09-30 DIAGNOSIS — D1801 Hemangioma of skin and subcutaneous tissue: Secondary | ICD-10-CM | POA: Diagnosis not present

## 2017-09-30 DIAGNOSIS — D225 Melanocytic nevi of trunk: Secondary | ICD-10-CM | POA: Diagnosis not present

## 2017-09-30 DIAGNOSIS — L814 Other melanin hyperpigmentation: Secondary | ICD-10-CM | POA: Diagnosis not present

## 2017-09-30 NOTE — Telephone Encounter (Signed)
I called and spoke with Anderson Malta at [facility] because I never received results for below, Anderson Malta said the patient actually had Mri on 09/13/17 and the report has not been finalized yet. She is going to sent a message to Rodena Piety Statistician) to check on this and get back with me.

## 2017-10-02 ENCOUNTER — Encounter: Payer: Self-pay | Admitting: *Deleted

## 2017-10-06 NOTE — Telephone Encounter (Signed)
Patient called again to find out status of result. I called [facility] again and spoke with [facility] and she said the MRI still have not be finalized and they were wait for previous mammogram imaging to compare. Stanton Kidney said she was going to send a message to the radiologist that patient is call requesting results. I called patient and told her this information and suggested she call [facility] and speak with them as well. Patient said she was going to do so.

## 2017-10-07 ENCOUNTER — Telehealth: Payer: Self-pay | Admitting: *Deleted

## 2017-10-07 NOTE — Telephone Encounter (Signed)
Patient called to let know Mri results from [facility] has arrived. Results will be scanned in.

## 2017-10-07 NOTE — Telephone Encounter (Signed)
See 10/07/17 encounter

## 2018-02-20 DIAGNOSIS — J209 Acute bronchitis, unspecified: Secondary | ICD-10-CM | POA: Diagnosis not present

## 2018-03-19 DIAGNOSIS — Z0000 Encounter for general adult medical examination without abnormal findings: Secondary | ICD-10-CM | POA: Diagnosis not present

## 2018-03-19 DIAGNOSIS — R0789 Other chest pain: Secondary | ICD-10-CM | POA: Diagnosis not present

## 2018-03-19 DIAGNOSIS — L301 Dyshidrosis [pompholyx]: Secondary | ICD-10-CM | POA: Diagnosis not present

## 2018-03-19 DIAGNOSIS — E7800 Pure hypercholesterolemia, unspecified: Secondary | ICD-10-CM | POA: Diagnosis not present

## 2018-03-19 DIAGNOSIS — J9801 Acute bronchospasm: Secondary | ICD-10-CM | POA: Diagnosis not present

## 2018-08-11 DIAGNOSIS — J45901 Unspecified asthma with (acute) exacerbation: Secondary | ICD-10-CM | POA: Diagnosis not present

## 2018-11-04 DIAGNOSIS — J45901 Unspecified asthma with (acute) exacerbation: Secondary | ICD-10-CM | POA: Diagnosis not present

## 2018-11-04 DIAGNOSIS — J9801 Acute bronchospasm: Secondary | ICD-10-CM | POA: Diagnosis not present

## 2018-11-09 ENCOUNTER — Encounter: Payer: Self-pay | Admitting: Gynecology

## 2018-12-02 DIAGNOSIS — Z803 Family history of malignant neoplasm of breast: Secondary | ICD-10-CM | POA: Diagnosis not present

## 2018-12-02 DIAGNOSIS — Z1231 Encounter for screening mammogram for malignant neoplasm of breast: Secondary | ICD-10-CM | POA: Diagnosis not present

## 2018-12-07 ENCOUNTER — Encounter: Payer: Self-pay | Admitting: Gynecology

## 2019-01-05 DIAGNOSIS — N6489 Other specified disorders of breast: Secondary | ICD-10-CM | POA: Diagnosis not present

## 2019-01-07 DIAGNOSIS — Z20828 Contact with and (suspected) exposure to other viral communicable diseases: Secondary | ICD-10-CM | POA: Diagnosis not present

## 2019-01-10 ENCOUNTER — Encounter: Payer: Self-pay | Admitting: Gynecology

## 2019-01-14 DIAGNOSIS — R110 Nausea: Secondary | ICD-10-CM | POA: Diagnosis not present

## 2019-01-14 DIAGNOSIS — U071 COVID-19: Secondary | ICD-10-CM | POA: Diagnosis not present

## 2019-04-13 DIAGNOSIS — Z0000 Encounter for general adult medical examination without abnormal findings: Secondary | ICD-10-CM | POA: Diagnosis not present

## 2019-04-13 DIAGNOSIS — E7800 Pure hypercholesterolemia, unspecified: Secondary | ICD-10-CM | POA: Diagnosis not present

## 2019-04-13 DIAGNOSIS — L301 Dyshidrosis [pompholyx]: Secondary | ICD-10-CM | POA: Diagnosis not present

## 2019-04-13 DIAGNOSIS — J9801 Acute bronchospasm: Secondary | ICD-10-CM | POA: Diagnosis not present

## 2019-04-13 DIAGNOSIS — R0789 Other chest pain: Secondary | ICD-10-CM | POA: Diagnosis not present

## 2019-12-15 DIAGNOSIS — Z1231 Encounter for screening mammogram for malignant neoplasm of breast: Secondary | ICD-10-CM | POA: Diagnosis not present

## 2020-04-16 ENCOUNTER — Other Ambulatory Visit: Payer: Self-pay

## 2020-04-16 ENCOUNTER — Ambulatory Visit
Admission: RE | Admit: 2020-04-16 | Discharge: 2020-04-16 | Disposition: A | Discharge status: Home / Self Care | Payer: BLUE CROSS/BLUE SHIELD | Source: Ambulatory Visit | Attending: Family Medicine | Admitting: Family Medicine

## 2020-04-16 ENCOUNTER — Other Ambulatory Visit: Payer: Self-pay | Admitting: Family Medicine

## 2020-04-16 DIAGNOSIS — M25551 Pain in right hip: Secondary | ICD-10-CM

## 2020-04-16 DIAGNOSIS — J9801 Acute bronchospasm: Secondary | ICD-10-CM | POA: Diagnosis not present

## 2020-04-16 DIAGNOSIS — I878 Other specified disorders of veins: Secondary | ICD-10-CM | POA: Diagnosis not present

## 2020-04-16 DIAGNOSIS — T7840XA Allergy, unspecified, initial encounter: Secondary | ICD-10-CM | POA: Diagnosis not present

## 2020-04-16 DIAGNOSIS — E7800 Pure hypercholesterolemia, unspecified: Secondary | ICD-10-CM | POA: Diagnosis not present

## 2020-04-16 DIAGNOSIS — M25552 Pain in left hip: Secondary | ICD-10-CM

## 2020-04-16 DIAGNOSIS — M47816 Spondylosis without myelopathy or radiculopathy, lumbar region: Secondary | ICD-10-CM | POA: Diagnosis not present

## 2020-04-16 DIAGNOSIS — M160 Bilateral primary osteoarthritis of hip: Secondary | ICD-10-CM | POA: Diagnosis not present

## 2020-06-01 DIAGNOSIS — M25552 Pain in left hip: Secondary | ICD-10-CM | POA: Diagnosis not present

## 2020-06-01 DIAGNOSIS — M25551 Pain in right hip: Secondary | ICD-10-CM | POA: Diagnosis not present

## 2020-06-14 DIAGNOSIS — M1611 Unilateral primary osteoarthritis, right hip: Secondary | ICD-10-CM | POA: Diagnosis not present

## 2020-07-03 DIAGNOSIS — Z0189 Encounter for other specified special examinations: Secondary | ICD-10-CM | POA: Diagnosis not present

## 2020-07-10 DIAGNOSIS — M1611 Unilateral primary osteoarthritis, right hip: Secondary | ICD-10-CM | POA: Diagnosis not present

## 2020-09-18 DIAGNOSIS — Z0189 Encounter for other specified special examinations: Secondary | ICD-10-CM | POA: Diagnosis not present

## 2020-10-09 DIAGNOSIS — M1612 Unilateral primary osteoarthritis, left hip: Secondary | ICD-10-CM | POA: Diagnosis not present

## 2020-11-28 DIAGNOSIS — Z09 Encounter for follow-up examination after completed treatment for conditions other than malignant neoplasm: Secondary | ICD-10-CM | POA: Diagnosis not present

## 2020-11-28 DIAGNOSIS — Z86010 Personal history of colonic polyps: Secondary | ICD-10-CM | POA: Diagnosis not present

## 2020-11-28 DIAGNOSIS — Z1211 Encounter for screening for malignant neoplasm of colon: Secondary | ICD-10-CM | POA: Diagnosis not present

## 2020-12-07 DIAGNOSIS — E7800 Pure hypercholesterolemia, unspecified: Secondary | ICD-10-CM | POA: Diagnosis not present

## 2020-12-07 DIAGNOSIS — J4520 Mild intermittent asthma, uncomplicated: Secondary | ICD-10-CM | POA: Diagnosis not present

## 2020-12-07 DIAGNOSIS — Z23 Encounter for immunization: Secondary | ICD-10-CM | POA: Diagnosis not present

## 2020-12-21 DIAGNOSIS — Z1231 Encounter for screening mammogram for malignant neoplasm of breast: Secondary | ICD-10-CM | POA: Diagnosis not present

## 2021-06-07 DIAGNOSIS — J4520 Mild intermittent asthma, uncomplicated: Secondary | ICD-10-CM | POA: Diagnosis not present

## 2021-06-07 DIAGNOSIS — E7800 Pure hypercholesterolemia, unspecified: Secondary | ICD-10-CM | POA: Diagnosis not present

## 2021-06-07 DIAGNOSIS — J301 Allergic rhinitis due to pollen: Secondary | ICD-10-CM | POA: Diagnosis not present

## 2021-12-24 DIAGNOSIS — Z1231 Encounter for screening mammogram for malignant neoplasm of breast: Secondary | ICD-10-CM | POA: Diagnosis not present

## 2022-03-13 DIAGNOSIS — M5412 Radiculopathy, cervical region: Secondary | ICD-10-CM | POA: Diagnosis not present

## 2022-03-13 DIAGNOSIS — M25532 Pain in left wrist: Secondary | ICD-10-CM | POA: Diagnosis not present

## 2022-04-09 DIAGNOSIS — M1811 Unilateral primary osteoarthritis of first carpometacarpal joint, right hand: Secondary | ICD-10-CM | POA: Diagnosis not present

## 2022-04-09 DIAGNOSIS — M79641 Pain in right hand: Secondary | ICD-10-CM | POA: Diagnosis not present

## 2022-04-09 DIAGNOSIS — M180 Bilateral primary osteoarthritis of first carpometacarpal joints: Secondary | ICD-10-CM | POA: Diagnosis not present

## 2022-04-09 DIAGNOSIS — M79642 Pain in left hand: Secondary | ICD-10-CM | POA: Diagnosis not present

## 2022-05-28 DIAGNOSIS — M180 Bilateral primary osteoarthritis of first carpometacarpal joints: Secondary | ICD-10-CM | POA: Diagnosis not present

## 2022-06-10 DIAGNOSIS — J301 Allergic rhinitis due to pollen: Secondary | ICD-10-CM | POA: Diagnosis not present

## 2022-06-10 DIAGNOSIS — E7800 Pure hypercholesterolemia, unspecified: Secondary | ICD-10-CM | POA: Diagnosis not present

## 2022-06-10 DIAGNOSIS — J4520 Mild intermittent asthma, uncomplicated: Secondary | ICD-10-CM | POA: Diagnosis not present

## 2023-06-16 ENCOUNTER — Other Ambulatory Visit (HOSPITAL_COMMUNITY): Payer: Self-pay | Admitting: Family Medicine

## 2023-06-16 DIAGNOSIS — Z136 Encounter for screening for cardiovascular disorders: Secondary | ICD-10-CM

## 2023-06-24 ENCOUNTER — Ambulatory Visit (HOSPITAL_BASED_OUTPATIENT_CLINIC_OR_DEPARTMENT_OTHER)
Admission: RE | Admit: 2023-06-24 | Discharge: 2023-06-24 | Disposition: A | Discharge status: Home / Self Care | Payer: Self-pay | Source: Ambulatory Visit | Attending: Family Medicine | Admitting: Family Medicine

## 2023-06-24 DIAGNOSIS — Z136 Encounter for screening for cardiovascular disorders: Secondary | ICD-10-CM | POA: Insufficient documentation
